# Patient Record
Sex: Male | Born: 1980 | Race: White | Hispanic: No | Marital: Single | Smoking: Former smoker
Health system: Southern US, Community
[De-identification: ages and names within clinical notes are randomized; demographics above are authoritative.]

## PROBLEM LIST (undated history)

## (undated) DIAGNOSIS — I10 Essential (primary) hypertension: Secondary | ICD-10-CM

## (undated) DIAGNOSIS — E785 Hyperlipidemia, unspecified: Secondary | ICD-10-CM

## (undated) DIAGNOSIS — E669 Obesity, unspecified: Secondary | ICD-10-CM

## (undated) HISTORY — PX: CARPAL TUNNEL RELEASE: SHX101

## (undated) HISTORY — DX: Essential (primary) hypertension: I10

## (undated) HISTORY — DX: Obesity, unspecified: E66.9

## (undated) HISTORY — DX: Hyperlipidemia, unspecified: E78.5

## (undated) HISTORY — PX: COLONOSCOPY: SHX174

---

## 2010-08-07 ENCOUNTER — Ambulatory Visit (INDEPENDENT_AMBULATORY_CARE_PROVIDER_SITE_OTHER): Payer: BC Managed Care – PPO | Admitting: Cardiology

## 2010-08-07 ENCOUNTER — Encounter: Payer: Self-pay | Admitting: Cardiology

## 2010-08-07 VITALS — BP 124/79 | HR 67 | Ht 66.0 in | Wt 237.0 lb

## 2010-08-07 DIAGNOSIS — I10 Essential (primary) hypertension: Secondary | ICD-10-CM

## 2010-08-07 DIAGNOSIS — F329 Major depressive disorder, single episode, unspecified: Secondary | ICD-10-CM

## 2010-08-07 DIAGNOSIS — E782 Mixed hyperlipidemia: Secondary | ICD-10-CM

## 2010-08-07 MED ORDER — VENLAFAXINE HCL 75 MG PO TABS: 75.0000 mg | ORAL_TABLET | ORAL | Status: DC

## 2010-08-07 NOTE — Progress Notes (Signed)
   Patient ID: Willie Osborne, male    DOB: 1981-03-18, 30 y.o.   MRN: [id number]  HPI  Willie Osborne comes in today self referred for history of HTN and HL. He has been under a tremendous amount of stress with domestic issues. He is having a hard time sleeping, losing desire to get up and work, and is a stress eater having gained 25 lbs.   He was prescribed Lipitor within the past year but had a major rash to it. He is currently not taking any meds for his BP of Lipids. 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  EKG is normal other than sinus bradycardia.    Review of Systems  [all other systems reviewed and are negative      Physical Exam  Constitutional:       Overweight, muscular, NAD  HENT:  Head: Normocephalic and atraumatic.  Eyes: EOM are normal. Pupils are equal, round, and reactive to light.  Neck: Neck supple. No JVD present. Tracheal deviation present. Thyromegaly present.  Cardiovascular: Normal rate, regular rhythm, normal heart sounds and intact distal pulses.   Pulmonary/Chest: Effort normal and breath sounds normal.  Abdominal: Soft.  Musculoskeletal: Normal range of motion. He exhibits no edema.  Skin: Skin is dry.  Psychiatric:       Depressed affect

## 2010-08-07 NOTE — Patient Instructions (Signed)
**Note De-Identified Dayan Desa Obfuscation** Your physician recommends that you schedule a follow-up appointment in: 3 months Your physician has recommended you make the following change in your medication: start taking Effexor 75mg   every morning

## 2010-08-23 ENCOUNTER — Encounter: Payer: Self-pay | Admitting: Cardiology

## 2010-09-10 ENCOUNTER — Telehealth: Payer: Self-pay

## 2010-09-10 DIAGNOSIS — F329 Major depressive disorder, single episode, unspecified: Secondary | ICD-10-CM

## 2010-09-10 MED ORDER — VENLAFAXINE HCL 100 MG PO TABS: 150.0000 mg | ORAL_TABLET | ORAL | Status: DC

## 2010-11-06 ENCOUNTER — Ambulatory Visit (INDEPENDENT_AMBULATORY_CARE_PROVIDER_SITE_OTHER): Payer: BC Managed Care – PPO | Admitting: Cardiology

## 2010-11-06 ENCOUNTER — Encounter: Payer: Self-pay | Admitting: Cardiology

## 2010-11-06 DIAGNOSIS — F32A Depression, unspecified: Secondary | ICD-10-CM | POA: Insufficient documentation

## 2010-11-06 DIAGNOSIS — F329 Major depressive disorder, single episode, unspecified: Secondary | ICD-10-CM | POA: Insufficient documentation

## 2010-11-06 DIAGNOSIS — E785 Hyperlipidemia, unspecified: Secondary | ICD-10-CM | POA: Insufficient documentation

## 2010-11-06 MED ORDER — VENLAFAXINE HCL ER 150 MG PO CP24: 150.0000 mg | ORAL_CAPSULE | Freq: Every day | ORAL | Status: DC

## 2010-11-06 NOTE — Assessment & Plan Note (Signed)
Will change to sustained release Effexor 150 mg q.h.s.

## 2010-11-06 NOTE — Patient Instructions (Addendum)
**Note De-Identified Pranika Finks Obfuscation** Your physician has recommended you make the following change in your medication: start taking Effexor 150mg  daily  Your physician recommends that you return for lab work in: 6 weeks   Your physician recommends that you schedule a follow-up appointment in: as needed

## 2010-11-06 NOTE — Assessment & Plan Note (Signed)
We'll obtain fasting lipids and a comprehensive metabolic profile. I will check with our Pharm D Lipid clinic to see if he could tolerate a different statin. I'm not sure I've ever seen a rash to his statin in the past.

## 2010-11-06 NOTE — Progress Notes (Signed)
HPI Willie Osborne returns today for followup of his hyperlipidemia and depression.  He had a significant rash to Lipitor. We did not try another statin.  His depression has been better on Effexor. However, he complains of having highs and lows when he takes it twice a day. He also gets extremely weak and dizzy about an hour after taking it in the mornings. His domestic situation is not improved in fact has gotten worse. He clearly needs to stay on medication. Past Medical History  Diagnosis Date  . Hyperlipidemia     No past surgical history on file.  No family history on file.  History   Social History  . Marital Status: Single    Spouse Name: bryanna Angerer    Number of Children: N/A  . Years of Education: N/A   Occupational History  . farm Hydrographic surveyor   Social History Main Topics  . Smoking status: Never Smoker   . Smokeless tobacco: Never Used  . Alcohol Use: Not on file  . Drug Use: No  . Sexually Active: Not on file   Other Topics Concern  . Not on file   Social History Narrative  . No narrative on file    Allergies  Allergen Reactions  . Lipitor (Atorvastatin Calcium) Other (See Comments)    neuralgia  . Sulfur     Current Outpatient Prescriptions  Medication Sig Dispense Refill  . DISCONTD: venlafaxine (EFFEXOR) 100 MG tablet Take 1.5 tablets (150 mg total) by mouth 1 dose over 46 hours. Take 1 tablet every morning  45 tablet  5  . venlafaxine (EFFEXOR XR) 150 MG 24 hr capsule Take 1 capsule (150 mg total) by mouth daily.  30 capsule  11    ROS Negative other than HPI.   PE General Appearance: well developed, well nourished in no acute distress, muscular, obese HEENT: symmetrical face, PERRLA, good dentition  Neck: no JVD, thyromegaly, or adenopathy, trachea midline Chest: symmetric without deformity Cardiac: PMI non-displaced, RRR, normal S1, S2, no gallop or murmur Lung: clear to ausculation and percussion Vascular: all pulses full  without bruits  Abdominal: nondistended, nontender, good bowel sounds, no HSM, no bruits Extremities: no cyanosis, clubbing or edema, no sign of DVT, no varicosities  Skin: normal color, no rashes Neuro: alert and oriented x 3, non-focal Pysch: normal affect Filed Vitals:   11/06/10 1157  BP: 129/81  Pulse: 63  Height: 5\' 6"  (1.676 m)  Weight: 234 lb (106.142 kg)  SpO2: 96%    EKG  Labs and Studies Reviewed.   No results found for this basename: WBC, HGB, HCT, MCV, PLT      Chemistry   No results found for this basename: NA, K, CL, CO2, BUN, CREATININE, GLU   No results found for this basename: CALCIUM, ALKPHOS, AST, ALT, BILITOT       No results found for this basename: CHOL   No results found for this basename: HDL   No results found for this basename: LDLCALC   No results found for this basename: TRIG   No results found for this basename: CHOLHDL   No results found for this basename: HGBA1C   No results found for this basename: ALT, AST, GGT, ALKPHOS, BILITOT   No results found for this basename: TSH

## 2010-12-13 ENCOUNTER — Encounter: Payer: Self-pay | Admitting: Family Medicine

## 2013-05-17 ENCOUNTER — Ambulatory Visit: Payer: Self-pay | Admitting: Physician Assistant

## 2013-07-12 ENCOUNTER — Encounter: Payer: Self-pay | Admitting: Physician Assistant

## 2013-07-12 ENCOUNTER — Ambulatory Visit (INDEPENDENT_AMBULATORY_CARE_PROVIDER_SITE_OTHER): Payer: BC Managed Care – PPO | Admitting: Physician Assistant

## 2013-07-12 VITALS — BP 118/78 | HR 68 | Temp 98.2°F | Resp 18 | Ht 67.0 in | Wt 240.0 lb

## 2013-07-12 DIAGNOSIS — J329 Chronic sinusitis, unspecified: Secondary | ICD-10-CM

## 2013-07-12 DIAGNOSIS — J209 Acute bronchitis, unspecified: Secondary | ICD-10-CM

## 2013-07-12 MED ORDER — BENZONATATE 200 MG PO CAPS: 200.0000 mg | ORAL_CAPSULE | Freq: Two times a day (BID) | ORAL | Status: DC | PRN

## 2013-07-12 MED ORDER — LEVOFLOXACIN 750 MG PO TABS: 750.0000 mg | ORAL_TABLET | Freq: Every day | ORAL | Status: DC

## 2013-07-12 MED ORDER — PREDNISONE 20 MG PO TABS: ORAL_TABLET | ORAL | Status: DC

## 2013-07-12 NOTE — Progress Notes (Signed)
Patient ID: Willie Osborne MRN: [id number], DOB: [birth year], 33 y.o. Date of Encounter: 07/12/2013, 10:50 AM    Chief Complaint:  Chief Complaint  Patient presents with  . recurrent sinus infection    has had abx x 2, (augmentin and levaquin)     HPI: 33 y.o. year old white  male says that he has gone to the [facility].  First visit there was in mid December. At that visit he was prescribed Augmentin Flonase and Tessalon. Second visit there was about 3 weeks ago.  Says that when he went to that visit he was having exact same symptoms that he is having now. Says at that visit they prescribed Levaquin-- for either 5 or 7 days--cannot remember which but remembers was either 5 or 7 days. Was also prescribed more Flonase and Tessalon at that time. Says that he completed the Levaquin and was 90% improved. However symptoms never completely resolved. Now his symptoms are back to being just as bad as they were when [facility] 3 weeks ago. He is having a lot of pressure congestion and pain in his frontal and maxillary sinus areas. Is blowing thick dark mucus from his nose.  Also has a bad cough with lots of thick dark phlegm.  I asked if He had any history of having such problems getting rid of infections. He says he has never had this problem. He rarely smokes. Does not even smoke a cigarette on a daily basis. Has had no known fever. 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      Home Meds: See attached medication section for any medications that were entered at today's visit. The computer does not put those onto this list.The following list is a list of meds entered prior to today's visit.   No current outpatient prescriptions on file prior to visit.   No current facility-administered medications on file prior to visit.    Allergies:  Allergies  Allergen Reactions  . Lipitor [Atorvastatin Calcium] Other (See Comments)   neuralgia  . Sulfur       Review of Systems: See HPI for pertinent ROS. All other ROS negative.    Physical Exam: Blood pressure 118/78, pulse 68, temperature 98.2 F (36.8 C), temperature source Oral, resp. rate 18, height 5\' 7"  (1.702 m), weight 240 lb (108.863 kg)., Body mass index is 37.58 kg/(m^2). General:  Stocky built white male. Appears in no acute distress. HEENT: Normocephalic, atraumatic, eyes without discharge, sclera non-icteric, nares are without discharge. Bilateral auditory canals clear, TM's are without perforation, pearly grey and translucent with reflective cone of light bilaterally. Oral cavity moist, posterior pharynx without exudate, erythema, peritonsillar abscess. Minimal tenderness with percussion of frontal and maxillary sinuses now. However he says that his areas were very sore and tender yesterday.  Neck: Supple. No thyromegaly. No lymphadenopathy. Lungs:  Very slight/minimal wheeze at the end of expiration throughout bilaterally.  Good air movement and good breath sounds.  Heart: Regular rhythm. No murmurs, rubs, or gallops. Msk:  Strength and tone normal for age. Extremities/Skin: Warm and dry. Neuro: Alert and oriented X 3. Moves all extremities spontaneously. Gait is normal. CNII-XII grossly in tact. Psych:  Responds to questions appropriately with a normal affect.     ASSESSMENT AND PLAN:  33 y.o. year old male with  1. Sinusitis - predniSONE (DELTASONE) 20 MG tablet; Take 3 daily for 2 days, then 2 daily for 2 days,  then 1 daily for 2 days.  Dispense: 12 tablet; Refill: 0 - levofloxacin (LEVAQUIN) 750 MG tablet; Take 1 tablet (750 mg total) by mouth daily.  Dispense: 10 tablet; Refill: 0 - benzonatate (TESSALON) 200 MG capsule; Take 1 capsule (200 mg total) by mouth 2 (two) times daily as needed for cough.  Dispense: 20 capsule; Refill: 0  2. Acute bronchitis - predniSONE (DELTASONE) 20 MG tablet; Take 3 daily for 2 days, then 2 daily for 2 days, then  1 daily for 2 days.  Dispense: 12 tablet; Refill: 0 - levofloxacin (LEVAQUIN) 750 MG tablet; Take 1 tablet (750 mg total) by mouth daily.  Dispense: 10 tablet; Refill: 0 - benzonatate (TESSALON) 200 MG capsule; Take 1 capsule (200 mg total) by mouth 2 (two) times daily as needed for cough.  Dispense: 20 capsule; Refill: 0  In addition to the above medications take Mucinex DM as an expectorant during the day. If his symptoms do not completely resolve by the completion of antibiotics , I have been told him to follow up with [place] immediately.  Signed, [address] [place], [place], [facility] 07/12/2013 10:50 AM

## 2013-10-22 ENCOUNTER — Encounter: Payer: Self-pay | Admitting: Family Medicine

## 2013-10-22 ENCOUNTER — Ambulatory Visit (INDEPENDENT_AMBULATORY_CARE_PROVIDER_SITE_OTHER): Payer: BC Managed Care – PPO | Admitting: Family Medicine

## 2013-10-22 VITALS — BP 128/70 | HR 64 | Temp 97.2°F | Resp 14 | Ht 66.0 in | Wt 247.0 lb

## 2013-10-22 DIAGNOSIS — Z0000 Encounter for general adult medical examination without abnormal findings: Secondary | ICD-10-CM

## 2013-10-22 DIAGNOSIS — B372 Candidiasis of skin and nail: Secondary | ICD-10-CM

## 2013-10-22 DIAGNOSIS — E785 Hyperlipidemia, unspecified: Secondary | ICD-10-CM

## 2013-10-22 MED ORDER — CLOTRIMAZOLE-BETAMETHASONE 1-0.05 % EX CREA: 1.0000 "application " | TOPICAL_CREAM | Freq: Two times a day (BID) | CUTANEOUS | Status: DC

## 2013-10-22 NOTE — Progress Notes (Signed)
   Subjective:    Patient ID: Willie Osborne, male    DOB: 1980-12-22, 33 y.o.   MRN: [id number]  HPI  Patient is overdue for fasting lab work. He would like to return fasting for CBC, CMP, fasting lipid panel. His biggest concern today is a rash in his groin. It is located between his scrotum and his bilateral medial thighs.   He has an erythematous patch in the intertriginous folds bilaterally.  He's tried topical over-the-counter antifungals with no success. There is no evidence of folliculitis or cellulitis or abscess. Past Medical History  Diagnosis Date  . Hyperlipidemia    No current outpatient prescriptions on file prior to visit.   No current facility-administered medications on file prior to visit.   Allergies  Allergen Reactions  . Lipitor [Atorvastatin Calcium] Other (See Comments)    neuralgia  . Sulfur    History   Social History  . Marital Status: Single    Spouse Name: bryanna Stork    Number of Children: N/A  . Years of Education: N/A   Occupational History  . farm Catering manager   Social History Main Topics  . Smoking status: Never Smoker   . Smokeless tobacco: Never Used  . Alcohol Use: Not on file  . Drug Use: No  . Sexual Activity: Not on file   Other Topics Concern  . Not on file   Social History Narrative  . No narrative on file     Review of Systems  All other systems reviewed and are negative.      Objective:   Physical Exam  Vitals reviewed. Cardiovascular: Normal rate and regular rhythm.   Pulmonary/Chest: Effort normal and breath sounds normal.  Genitourinary: Penis normal.  Lymphadenopathy:       Right: No inguinal adenopathy present.       Left: No inguinal adenopathy present.  Skin: Rash noted. There is erythema.   Intertrigo-like rash in the intertriginous folds between the scrotum and the thigh bilaterally characterized by an erythematous patch with satellite erythematous macules.       Assessment  & Plan:  1. Candidiasis, intertrigo At present reading twice a day for 10 days.   - clotrimazole-betamethasone (LOTRISONE) cream; Apply 1 application topically 2 (two) times daily.  Dispense: 30 g; Refill: 2  2. HLD (hyperlipidemia) Return fasting for a fasting lipid panel and CMP at the patient's earliest convenience. - COMPLETE METABOLIC PANEL WITH GFR; Future - Lipid panel; Future  3. Routine general medical examination at a health care facility Patient is overdue for complete physical exam. I like to draw CBC prior to his complete physical exam.  He can schedule the complete physical exam at his earliest convenience. - CBC with Differential; Future

## 2014-02-02 NOTE — H&P (Signed)
  NTS SOAP Note  Vital Signs:  Vitals as of: 2/50/5397: Systolic 673: Diastolic 90: Heart Rate 59: Temp 98.64F: Height 58ft 6in: Weight 250Lbs 0 Ounces: Pain Level 3: BMI 40.35  BMI : 40.35 kg/m2  Subjective: This 33 year old male presents for of a nonresolving cyst on the scrotum.  Had i and d in the past,  but wound keeps getting infected and indurated.  Intermittent drainage noted.  Several rounds of antibiotics not helpful.  Review of Symptoms:  Constitutional:unremarkable   Head:unremarkable Eyes:unremarkable  negative Nose/Mouth/Throat:unremarkable Cardiovascular:  unremarkable Respiratory:unremarkable Gastrointestinal:  unremarkable   Genitourinary:unremarkable   Musculoskeletal:unremarkable Hematolgic/Lymphatic:unremarkable   Allergic/Immunologic:unremarkable   Past Medical History:  Reviewed  Past Medical History  Surgical History: carpal tunnel release right Medical Problems: none Allergies: sulfa Medications: none   Social History:Reviewed  Social History  Preferred Language: English Race:  White Ethnicity: Not Hispanic / Latino Age: 64 year Marital Status:  S Alcohol: socially   Smoking Status: Never smoker reviewed on 02/01/2014 Functional Status reviewed on 02/01/2014 ------------------------------------------------ Bathing: Normal Cooking: Normal Dressing: Normal Driving: Normal Eating: Normal Managing Meds: Normal Oral Care: Normal Shopping: Normal Toileting: Normal Transferring: Normal Walking: Normal Cognitive Status reviewed on 02/01/2014 ------------------------------------------------ Attention: Normal Decision Making: Normal Language: Normal Memory: Normal Motor: Normal Perception: Normal Problem Solving: Normal Visual and Spatial: Normal   Family History:Reviewed  Family Health History Mother, Living; Healthy;  Father, Living; Healthy;     Objective Information: General:Well appearing, well  nourished in no distress. 2cm residual cystic open wound on base of scrotum.  No purulent drainage currently. Heart:RRR, no murmur Lungs:  CTA bilaterally, no wheezes, rhonchi, rales.  Breathing unlabored.  Assessment:Sebaceous cyst,  scrotum,  recurrent  Diagnoses: 706.2  L72.3 Epidermoid cyst of skin (Sebaceous cyst)  Procedures: 41937 - OFFICE OUTPATIENT NEW 20 MINUTES    Plan:  Scheduled for excision of sebaceous cyst,  scrotum on 02/25/14.   Patient Education:Alternative treatments to surgery were discussed with patient (and family).  Risks and benefits  of procedure were fully explained to the patient (and family) who gave informed consent. Patient/family questions were addressed.  Follow-up:Pending Surgery

## 2014-02-10 ENCOUNTER — Encounter (HOSPITAL_COMMUNITY): Payer: Self-pay | Admitting: Pharmacy Technician

## 2014-02-18 NOTE — Patient Instructions (Signed)
Willie Osborne  02/18/2014   Your procedure is scheduled on:  02/25/14  Report to [facility] at 06:15 AM.  Call this number if you have problems the morning of surgery: [phone]   Remember:   Do not eat food or drink liquids after midnight.   Take these medicines the morning of surgery with A SIP OF WATER: None   Do not wear jewelry, make-up or nail polish.  Do not wear lotions, powders, or perfumes. You may wear deodorant.  Do not shave 48 hours prior to surgery. Men may shave face and neck.  Do not bring valuables to the hospital.  [facility] is not responsible for any belongings or valuables.               Contacts, dentures or bridgework may not be worn into surgery.  Leave suitcase in the car. After surgery it may be brought to your room.  For patients admitted to the hospital, discharge time is determined by your treatment team.               Patients discharged the day of surgery will not be allowed to drive home.    Special Instructions: Shower using Hibiclens the night before surgery and the morning of surgery.   Please read over the following fact sheets that you were given: Pain Booklet, Anesthesia Post-op Instructions and Care and Recovery After Surgery    Cyst Removal Your caregiver has removed a cyst. A cyst is a sac containing a semi-solid material. Cysts may occur any place on your body. They may remain small for years or gradually get larger. A sebaceous cyst is an enlarged (dilated) sweat gland filled with old sweat (sebum). Unattended, these may become large (the size of a softball) over several years time. These are often removed for improved appearance (cosmetic) reasons or before they become infected to form an abscess. An abscess is an infected cyst. HOME CARE INSTRUCTIONS   Keep your bandage clean and dry. You may change your bandage after 24 hours. If your bandage sticks, use warm water to gently loosen it. Pat the area dry with a clean towel before  putting on another bandage.  If possible, keep the area where the cyst was removed raised to relieve soreness, swelling, and promote healing.  If you have stitches, keep them clean and dry.  You may clean your stitches gently with a cotton swab dipped in warm soapy water.  Do not soak the area where the cyst was removed or go swimming. You may shower.  Do not overuse the area where your cyst was removed.  Return in 7 days or as directed to have your stitches removed.  Take medicines as instructed by your caregiver. SEEK IMMEDIATE MEDICAL CARE IF:   An oral temperature above 102 F (38.9 C) develops, not controlled by medication.  Blood continues to soak through the bandage.  You have increasing pain in the area where your cyst was removed.  You have redness, swelling, pus, a bad smell, soreness (inflammation), or red streaks coming away from the stitches. These are signs of infection. MAKE SURE YOU:   Understand these instructions.  Will watch your condition.  Will get help right away if you are not doing well or get worse. Document Released: 04/26/2000 Document Revised: 07/22/2011 Document Reviewed: 08/20/2007 [facility] Patient Information 2015 [place], [place]. This information is not intended to replace advice given to you by your health care provider. Make sure you discuss any questions  you have with your health care provider.    PATIENT INSTRUCTIONS POST-ANESTHESIA  IMMEDIATELY FOLLOWING SURGERY:  Do not drive or operate machinery for the first twenty four hours after surgery.  Do not make any important decisions for twenty four hours after surgery or while taking narcotic pain medications or sedatives.  If you develop intractable nausea and vomiting or a severe headache please notify your doctor immediately.  FOLLOW-UP:  Please make an appointment with your surgeon as instructed. You do not need to follow up with anesthesia unless specifically instructed to do  so.  WOUND CARE INSTRUCTIONS (if applicable):  Keep a dry clean dressing on the anesthesia/puncture wound site if there is drainage.  Once the wound has quit draining you may leave it open to air.  Generally you should leave the bandage intact for twenty four hours unless there is drainage.  If the epidural site drains for more than 36-48 hours please call the anesthesia department.  QUESTIONS?:  Please feel free to call your physician or the hospital operator if you have any questions, and they will be happy to assist you.

## 2014-02-21 ENCOUNTER — Encounter (HOSPITAL_COMMUNITY): Payer: Self-pay

## 2014-02-21 ENCOUNTER — Encounter (HOSPITAL_COMMUNITY)
Admission: RE | Admit: 2014-02-21 | Discharge: 2014-02-21 | Disposition: A | Discharge status: Home / Self Care | Payer: BC Managed Care – PPO | Source: Ambulatory Visit | Attending: General Surgery | Admitting: General Surgery

## 2014-02-21 DIAGNOSIS — F329 Major depressive disorder, single episode, unspecified: Secondary | ICD-10-CM | POA: Insufficient documentation

## 2014-02-21 DIAGNOSIS — Z882 Allergy status to sulfonamides status: Secondary | ICD-10-CM | POA: Insufficient documentation

## 2014-02-21 DIAGNOSIS — E785 Hyperlipidemia, unspecified: Secondary | ICD-10-CM | POA: Diagnosis not present

## 2014-02-21 DIAGNOSIS — L728 Other follicular cysts of the skin and subcutaneous tissue: Secondary | ICD-10-CM | POA: Insufficient documentation

## 2014-02-21 DIAGNOSIS — Z0000 Encounter for general adult medical examination without abnormal findings: Secondary | ICD-10-CM | POA: Diagnosis not present

## 2014-02-21 DIAGNOSIS — Z888 Allergy status to other drugs, medicaments and biological substances status: Secondary | ICD-10-CM | POA: Diagnosis not present

## 2014-02-21 LAB — CBC WITH DIFFERENTIAL/PLATELET
BASOS PCT: 1 % (ref 0–1)
Basophils Absolute: 0.1 10*3/uL (ref 0.0–0.1)
EOS ABS: 0.3 10*3/uL (ref 0.0–0.7)
Eosinophils Relative: 5 % (ref 0–5)
HCT: 45.9 % (ref 39.0–52.0)
Hemoglobin: 16.1 g/dL (ref 13.0–17.0)
LYMPHS ABS: 1.9 10*3/uL (ref 0.7–4.0)
Lymphocytes Relative: 27 % (ref 12–46)
MCH: 31.7 pg (ref 26.0–34.0)
MCHC: 35.1 g/dL (ref 30.0–36.0)
MCV: 90.4 fL (ref 78.0–100.0)
Monocytes Absolute: 0.4 10*3/uL (ref 0.1–1.0)
Monocytes Relative: 6 % (ref 3–12)
Neutro Abs: 4.4 10*3/uL (ref 1.7–7.7)
Neutrophils Relative %: 62 % (ref 43–77)
Platelets: 133 10*3/uL — ABNORMAL LOW (ref 150–400)
RBC: 5.08 MIL/uL (ref 4.22–5.81)
RDW: 13.2 % (ref 11.5–15.5)
WBC: 7.1 10*3/uL (ref 4.0–10.5)

## 2014-02-21 LAB — BASIC METABOLIC PANEL
Anion gap: 12 (ref 5–15)
BUN: 15 mg/dL (ref 6–23)
CO2: 24 mEq/L (ref 19–32)
Calcium: 8.9 mg/dL (ref 8.4–10.5)
Chloride: 104 mEq/L (ref 96–112)
Creatinine, Ser: 0.97 mg/dL (ref 0.50–1.35)
GFR calc non Af Amer: >90 mL/min (ref >90)
Glucose, Bld: 104 mg/dL — ABNORMAL HIGH (ref 70–99)
Potassium: 4.4 mEq/L (ref 3.7–5.3)
SODIUM: 140 meq/L (ref 137–147)

## 2014-02-21 NOTE — Pre-Procedure Instructions (Signed)
Patient given information to sign up for my chart at home. 

## 2014-02-25 ENCOUNTER — Encounter (HOSPITAL_COMMUNITY): Payer: BC Managed Care – PPO | Admitting: Anesthesiology

## 2014-02-25 ENCOUNTER — Ambulatory Visit (HOSPITAL_COMMUNITY): Payer: BC Managed Care – PPO | Admitting: Anesthesiology

## 2014-02-25 ENCOUNTER — Encounter (HOSPITAL_COMMUNITY)
Admission: RE | Disposition: A | Discharge status: Home / Self Care | Payer: Self-pay | Source: Ambulatory Visit | Attending: General Surgery

## 2014-02-25 ENCOUNTER — Ambulatory Visit (HOSPITAL_COMMUNITY)
Admission: RE | Admit: 2014-02-25 | Discharge: 2014-02-25 | Disposition: A | Discharge status: Home / Self Care | Payer: BC Managed Care – PPO | Source: Ambulatory Visit | Attending: General Surgery | Admitting: General Surgery

## 2014-02-25 ENCOUNTER — Encounter (HOSPITAL_COMMUNITY): Payer: Self-pay

## 2014-02-25 DIAGNOSIS — L729 Follicular cyst of the skin and subcutaneous tissue, unspecified: Secondary | ICD-10-CM | POA: Diagnosis not present

## 2014-02-25 DIAGNOSIS — F329 Major depressive disorder, single episode, unspecified: Secondary | ICD-10-CM | POA: Diagnosis not present

## 2014-02-25 DIAGNOSIS — Z87891 Personal history of nicotine dependence: Secondary | ICD-10-CM | POA: Diagnosis not present

## 2014-02-25 HISTORY — PX: EAR CYST EXCISION: SHX22

## 2014-02-25 SURGERY — CYST REMOVAL
Anesthesia: General | Site: Scrotum

## 2014-02-25 MED ORDER — LACTATED RINGERS IV SOLN
INTRAVENOUS | Status: DC | PRN
  Administered 2014-02-25: 07:00:00 via INTRAVENOUS

## 2014-02-25 MED ORDER — KETOROLAC TROMETHAMINE 30 MG/ML IJ SOLN
30.0000 mg | Freq: Once | INTRAMUSCULAR | Status: AC
  Administered 2014-02-25: 30 mg via INTRAVENOUS
  Filled 2014-02-25: qty 1

## 2014-02-25 MED ORDER — HYDROCODONE-ACETAMINOPHEN 5-325 MG PO TABS: 1.0000 | ORAL_TABLET | ORAL | Status: AC | PRN

## 2014-02-25 MED ORDER — ONDANSETRON HCL 4 MG/2ML IJ SOLN
INTRAMUSCULAR | Status: DC | PRN
  Administered 2014-02-25: 4 mg via INTRAVENOUS

## 2014-02-25 MED ORDER — LIDOCAINE HCL (CARDIAC) 20 MG/ML IV SOLN
INTRAVENOUS | Status: DC | PRN
  Administered 2014-02-25: 80 mg via INTRAVENOUS

## 2014-02-25 MED ORDER — BUPIVACAINE HCL (PF) 0.5 % IJ SOLN
INTRAMUSCULAR | Status: AC
  Filled 2014-02-25: qty 30

## 2014-02-25 MED ORDER — FENTANYL CITRATE 0.05 MG/ML IJ SOLN
INTRAMUSCULAR | Status: AC
  Filled 2014-02-25: qty 5

## 2014-02-25 MED ORDER — MIDAZOLAM HCL 2 MG/2ML IJ SOLN
INTRAMUSCULAR | Status: AC
  Filled 2014-02-25: qty 2

## 2014-02-25 MED ORDER — POVIDONE-IODINE 10 % EX OINT
TOPICAL_OINTMENT | CUTANEOUS | Status: AC
  Filled 2014-02-25: qty 1

## 2014-02-25 MED ORDER — CHLORHEXIDINE GLUCONATE 4 % EX LIQD: 1.0000 "application " | Freq: Once | CUTANEOUS | Status: DC

## 2014-02-25 MED ORDER — MIDAZOLAM HCL 2 MG/2ML IJ SOLN
1.0000 mg | INTRAMUSCULAR | Status: DC | PRN
  Administered 2014-02-25: 2 mg via INTRAVENOUS

## 2014-02-25 MED ORDER — PROPOFOL 10 MG/ML IV EMUL
INTRAVENOUS | Status: AC
  Filled 2014-02-25: qty 20

## 2014-02-25 MED ORDER — FENTANYL CITRATE 0.05 MG/ML IJ SOLN
INTRAMUSCULAR | Status: AC
  Filled 2014-02-25: qty 2

## 2014-02-25 MED ORDER — BUPIVACAINE HCL (PF) 0.5 % IJ SOLN
INTRAMUSCULAR | Status: DC | PRN
  Administered 2014-02-25: 2 mL

## 2014-02-25 MED ORDER — CEFAZOLIN SODIUM-DEXTROSE 2-3 GM-% IV SOLR
INTRAVENOUS | Status: AC
  Filled 2014-02-25: qty 50

## 2014-02-25 MED ORDER — ONDANSETRON HCL 4 MG/2ML IJ SOLN
INTRAMUSCULAR | Status: AC
  Filled 2014-02-25: qty 2

## 2014-02-25 MED ORDER — CEFAZOLIN SODIUM-DEXTROSE 2-3 GM-% IV SOLR: 2.0000 g | INTRAVENOUS | Status: DC

## 2014-02-25 MED ORDER — PROPOFOL 10 MG/ML IV BOLUS
INTRAVENOUS | Status: DC | PRN
Start: 2014-02-25 — End: 2014-02-25
  Administered 2014-02-25: 300 mg via INTRAVENOUS

## 2014-02-25 MED ORDER — CEFAZOLIN SODIUM-DEXTROSE 2-3 GM-% IV SOLR
INTRAVENOUS | Status: DC | PRN
  Administered 2014-02-25: 2 g via INTRAVENOUS

## 2014-02-25 MED ORDER — FENTANYL CITRATE 0.05 MG/ML IJ SOLN
INTRAMUSCULAR | Status: DC | PRN
  Administered 2014-02-25 (×3): 50 ug via INTRAVENOUS

## 2014-02-25 MED ORDER — SODIUM CHLORIDE 0.9 % IR SOLN
Status: DC | PRN
  Administered 2014-02-25: 1000 mL

## 2014-02-25 MED ORDER — LIDOCAINE HCL (PF) 1 % IJ SOLN
INTRAMUSCULAR | Status: AC
  Filled 2014-02-25: qty 5

## 2014-02-25 MED ORDER — GLYCOPYRROLATE 0.2 MG/ML IJ SOLN
INTRAMUSCULAR | Status: AC
  Filled 2014-02-25: qty 1

## 2014-02-25 MED ORDER — FENTANYL CITRATE 0.05 MG/ML IJ SOLN
25.0000 ug | INTRAMUSCULAR | Status: AC
  Administered 2014-02-25 (×2): 25 ug via INTRAVENOUS

## 2014-02-25 MED ORDER — LACTATED RINGERS IV SOLN
INTRAVENOUS | Status: DC
  Administered 2014-02-25: 07:00:00 via INTRAVENOUS

## 2014-02-25 MED ORDER — GLYCOPYRROLATE 0.2 MG/ML IJ SOLN
0.2000 mg | Freq: Once | INTRAMUSCULAR | Status: AC
  Administered 2014-02-25: 0.2 mg via INTRAVENOUS

## 2014-02-25 MED ORDER — ONDANSETRON HCL 4 MG/2ML IJ SOLN: 4.0000 mg | Freq: Once | INTRAMUSCULAR | Status: DC | PRN

## 2014-02-25 MED ORDER — ONDANSETRON HCL 4 MG/2ML IJ SOLN
4.0000 mg | Freq: Once | INTRAMUSCULAR | Status: AC
  Administered 2014-02-25: 4 mg via INTRAVENOUS

## 2014-02-25 MED ORDER — FENTANYL CITRATE 0.05 MG/ML IJ SOLN: 25.0000 ug | INTRAMUSCULAR | Status: DC | PRN

## 2014-02-25 SURGICAL SUPPLY — 38 items
ADH SKN CLS APL DERMABOND .7 (GAUZE/BANDAGES/DRESSINGS) ×1
BAG HAMPER (MISCELLANEOUS) ×3 IMPLANT
CLOTH BEACON ORANGE TIMEOUT ST (SAFETY) ×3 IMPLANT
COVER LIGHT HANDLE STERIS (MISCELLANEOUS) ×6 IMPLANT
COVER MAYO STAND XLG (DRAPE) ×2 IMPLANT
DECANTER SPIKE VIAL GLASS SM (MISCELLANEOUS) ×3 IMPLANT
DERMABOND ADVANCED (GAUZE/BANDAGES/DRESSINGS) ×2
DERMABOND ADVANCED .7 DNX12 (GAUZE/BANDAGES/DRESSINGS) IMPLANT
DRAPE EENT ADH APERT 31X51 STR (DRAPES) ×3 IMPLANT
ELECT NDL TIP 2.8 STRL (NEEDLE) IMPLANT
ELECT NEEDLE TIP 2.8 STRL (NEEDLE) IMPLANT
ELECT REM PT RETURN 9FT ADLT (ELECTROSURGICAL) ×3
ELECTRODE REM PT RTRN 9FT ADLT (ELECTROSURGICAL) ×1 IMPLANT
FORMALIN 10 PREFIL 120ML (MISCELLANEOUS) ×3 IMPLANT
GLOVE BIOGEL PI IND STRL 7.0 (GLOVE) IMPLANT
GLOVE BIOGEL PI IND STRL 7.5 (GLOVE) IMPLANT
GLOVE BIOGEL PI INDICATOR 7.0 (GLOVE) ×2
GLOVE BIOGEL PI INDICATOR 7.5 (GLOVE) ×2
GLOVE ECLIPSE 7.0 STRL STRAW (GLOVE) ×2 IMPLANT
GLOVE SURG SS PI 7.5 STRL IVOR (GLOVE) ×6 IMPLANT
GOWN STRL REUS W/TWL LRG LVL3 (GOWN DISPOSABLE) ×6 IMPLANT
KIT ROOM TURNOVER APOR (KITS) ×3 IMPLANT
MANIFOLD NEPTUNE II (INSTRUMENTS) ×3 IMPLANT
NDL HYPO 25X1 1.5 SAFETY (NEEDLE) ×1 IMPLANT
NEEDLE HYPO 25X1 1.5 SAFETY (NEEDLE) ×3 IMPLANT
NS IRRIG 1000ML POUR BTL (IV SOLUTION) ×3 IMPLANT
PACK MINOR (CUSTOM PROCEDURE TRAY) ×2 IMPLANT
PAD ARMBOARD 7.5X6 YLW CONV (MISCELLANEOUS) ×3 IMPLANT
SET BASIN LINEN APH (SET/KITS/TRAYS/PACK) ×3 IMPLANT
SHEET LAVH (DRAPES) ×2 IMPLANT
SUT ETHILON 3 0 FSL (SUTURE) IMPLANT
SUT MNCRL AB 4-0 PS2 18 (SUTURE) ×4 IMPLANT
SUT PROLENE 4 0 PS 2 18 (SUTURE) IMPLANT
SUT VIC AB 3-0 SH 27 (SUTURE)
SUT VIC AB 3-0 SH 27X BRD (SUTURE) IMPLANT
SUT VIC AB 4-0 PS2 27 (SUTURE) IMPLANT
SYRINGE CONTROL L 12CC (SYRINGE) ×3 IMPLANT
SYRINGE CONTROL LL 12CC (SYRINGE) ×1 IMPLANT

## 2014-02-25 NOTE — Transfer of Care (Signed)
Immediate Anesthesia Transfer of Care Note  Patient: Willie Osborne  Procedure(s) Performed: Procedure(s): EXCISION CYST, SCROTUM (N/A)  Patient Location: PACU  Anesthesia Type:General  Level of Consciousness: awake, alert , oriented and patient cooperative  Airway & Oxygen Therapy: Patient Spontanous Breathing and Patient connected to face mask oxygen  Post-op Assessment: Report given to PACU RN, Post -op Vital signs reviewed and stable and Patient moving all extremities  Post vital signs: Reviewed and stable  Complications: No apparent anesthesia complications

## 2014-02-25 NOTE — Anesthesia Postprocedure Evaluation (Signed)
  Anesthesia Post-op Note  Patient: Willie Osborne  Procedure(s) Performed: Procedure(s): EXCISION CYST, SCROTUM (N/A)  Patient Location: PACU  Anesthesia Type:General  Level of Consciousness: awake, alert , oriented and patient cooperative  Airway and Oxygen Therapy: Patient Spontanous Breathing and Patient connected to face mask oxygen  Post-op Pain: none  Post-op Assessment: Post-op Vital signs reviewed, Patient's Cardiovascular Status Stable, Respiratory Function Stable and No signs of Nausea or vomiting  Post-op Vital Signs: Reviewed and stable  Last Vitals:  Filed Vitals:   02/25/14 0720  BP: 130/76  Pulse:   Temp:   Resp: 17    Complications: No apparent anesthesia complications

## 2014-02-25 NOTE — Interval H&P Note (Signed)
History and Physical Interval Note:  02/25/2014 7:23 AM  Willie Osborne  has presented today for surgery, with the diagnosis of cyst,scrotum  The various methods of treatment have been discussed with the patient and family. After consideration of risks, benefits and other options for treatment, the patient has consented to  Procedure(s): EXCISION CYST, SCROTUM (N/A) as a surgical intervention .  The patient's history has been reviewed, patient examined, no change in status, stable for surgery.  I have reviewed the patient's chart and labs.  Questions were answered to the patient's satisfaction.     Aviva Signs A

## 2014-02-25 NOTE — Anesthesia Preprocedure Evaluation (Signed)
Anesthesia Evaluation  Patient identified by MRN, date of birth, ID band Patient awake    Reviewed: Allergy & Precautions, H&P , NPO status , Patient's Chart, lab work & pertinent test results  Airway Mallampati: III TM Distance: >3 FB Neck ROM: Full    Dental  (+) Teeth Intact   Pulmonary former smoker,  breath sounds clear to auscultation        Cardiovascular negative cardio ROS  Rhythm:Regular Rate:Normal     Neuro/Psych PSYCHIATRIC DISORDERS Depression    GI/Hepatic negative GI ROS,   Endo/Other  Morbid obesity  Renal/GU      Musculoskeletal   Abdominal   Peds  Hematology   Anesthesia Other Findings   Reproductive/Obstetrics                           Anesthesia Physical Anesthesia Plan  ASA: II  Anesthesia Plan: General   Post-op Pain Management:    Induction: Intravenous  Airway Management Planned: LMA  Additional Equipment:   Intra-op Plan:   Post-operative Plan: Extubation in OR  Informed Consent: I have reviewed the patients History and Physical, chart, labs and discussed the procedure including the risks, benefits and alternatives for the proposed anesthesia with the patient or authorized representative who has indicated his/her understanding and acceptance.     Plan Discussed with:   Anesthesia Plan Comments:         Anesthesia Quick Evaluation

## 2014-02-25 NOTE — Op Note (Signed)
Patient:  Willie Osborne  DOB:  Jan 25, 1981  MRN:  [id number]   Preop Diagnosis:  Cyst, scrotum  Postop Diagnosis:  Same  Procedure:  Excision of 2 cm cyst, scrotum  Surgeon:  Aviva Signs, M.D.  Anes:  General  Indications:  Patient is a 33 year old white male who has undergone multiple incision and drainage of a cyst at the base of the scrotum and now presents for formal excision. The risks and benefits of the procedure were fully explained to the patient, who gave informed consent.  Procedure note:  The patient is placed the supine position. After general anesthesia was administered, the patient was placed in the lithotomy position. The base of the scrotum was prepped and draped using usual sterile technique with Betadine. Surgical site confirmation was performed.  At the base of the scrotum along the midline, 2 cm cystic lesion was noted. No drainage was noted at this time. Elliptical incision was made around this and this cyst was excised down to the subcutaneous layer without difficulty. It was disposed of. Any bleeding was controlled using Bovie electrocautery. 0.5% Sensorcaine was instilled the surrounding wound. The skin edges were reapproximated using a 4-0 Monocryl subcuticular suture. Dermabond was applied.  All tape and needle counts were correct at the end of the procedure. Patient was awakened and transferred to PACU in stable condition.  Complications:  None  EBL:  Minimal  Specimen:  None

## 2014-02-25 NOTE — Discharge Instructions (Signed)
Cyst Removal   Your caregiver has removed a cyst. A cyst is a sac containing a semi-solid material. Cysts may occur any place on your body. They may remain small for years or gradually get larger. A sebaceous cyst is an enlarged (dilated) sweat gland filled with old sweat (sebum). Unattended, these may become large (the size of a softball) over several years time. These are often removed for improved appearance (cosmetic) reasons or before they become infected to form an abscess. An abscess is an infected cyst.   HOME CARE INSTRUCTIONS   Keep your bandage clean and dry. You may change your bandage after 24 hours. If your bandage sticks, use warm water to gently loosen it. Pat the area dry with a clean towel before putting on another bandage.   If possible, keep the area where the cyst was removed raised to relieve soreness, swelling, and promote healing.   If you have stitches, keep them clean and dry.   You may clean your stitches gently with a cotton swab dipped in warm soapy water.   Do not soak the area where the cyst was removed or go swimming. You may shower.   Do not overuse the area where your cyst was removed.   Return in 7 days or as directed to have your stitches removed.   Take medicines as instructed by your caregiver.  SEEK IMMEDIATE MEDICAL CARE IF:   An oral temperature above 102° F (38.9° C) develops, not controlled by medication.   Blood continues to soak through the bandage.   You have increasing pain in the area where your cyst was removed.   You have redness, swelling, pus, a bad smell, soreness (inflammation), or red streaks coming away from the stitches. These are signs of infection.  MAKE SURE YOU:   Understand these instructions.   Will watch your condition.   Will get help right away if you are not doing well or get worse.  Document Released: 04/26/2000 Document Revised: 07/22/2011 Document Reviewed: 08/20/2007   ExitCare® Patient Information ©2015 [employer]. This information is not  intended to replace advice given to you by your health care provider. Make sure you discuss any questions you have with your health care provider.

## 2014-02-25 NOTE — Addendum Note (Signed)
Addendum created 02/25/14 1241 by Michele Rockers, CRNA   Modules edited: Charges VN

## 2014-02-25 NOTE — Anesthesia Procedure Notes (Signed)
Procedure Name: LMA Insertion Date/Time: 02/25/2014 7:40 AM Performed by: Gershon Mussel, Nameer Summer Pre-anesthesia Checklist: Patient identified, Emergency Drugs available, Suction available, Patient being monitored and Timeout performed Patient Re-evaluated:Patient Re-evaluated prior to inductionOxygen Delivery Method: Circle system utilized Preoxygenation: Pre-oxygenation with 100% oxygen Intubation Type: IV induction LMA: LMA inserted LMA Size: 4.0 Placement Confirmation: positive ETCO2 and breath sounds checked- equal and bilateral

## 2014-02-25 NOTE — Addendum Note (Signed)
Addendum created 02/25/14 1519 by Mickel Baas, CRNA   Modules edited: Charges VN

## 2014-05-13 DIAGNOSIS — G4730 Sleep apnea, unspecified: Secondary | ICD-10-CM

## 2014-05-13 HISTORY — DX: Sleep apnea, unspecified: G47.30

## 2015-10-25 ENCOUNTER — Ambulatory Visit (INDEPENDENT_AMBULATORY_CARE_PROVIDER_SITE_OTHER): Payer: PRIVATE HEALTH INSURANCE | Admitting: Physician Assistant

## 2015-10-25 ENCOUNTER — Encounter: Payer: Self-pay | Admitting: Physician Assistant

## 2015-10-25 VITALS — BP 124/86 | HR 68 | Temp 98.7°F | Resp 18 | Wt 240.0 lb

## 2015-10-25 DIAGNOSIS — T148 Other injury of unspecified body region: Secondary | ICD-10-CM

## 2015-10-25 DIAGNOSIS — L239 Allergic contact dermatitis, unspecified cause: Secondary | ICD-10-CM

## 2015-10-25 DIAGNOSIS — L200 Besnier's prurigo: Secondary | ICD-10-CM

## 2015-10-25 DIAGNOSIS — W57XXXA Bitten or stung by nonvenomous insect and other nonvenomous arthropods, initial encounter: Secondary | ICD-10-CM

## 2015-10-25 MED ORDER — PREDNISONE 20 MG PO TABS: ORAL_TABLET | ORAL | Status: DC

## 2015-10-25 NOTE — Progress Notes (Signed)
    Patient ID: LJ SLIMAN MRN: [id number], DOB: [birth year], 35 y.o. Date of Encounter: 10/25/2015, 2:54 PM    Chief Complaint:  Chief Complaint  Patient presents with  . tick bite 1 week ago    now breaking out, not sure if from tick or poison     HPI: 35 y.o. year old white male presents with above.   Says that Thursday morning he found tic on right low back. Was able to pull it off easily.   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  Says that on Saturday he started to develop rash on right arm, then left arm, then chest/abdoemn.   Wasn't sure if rash related to tic bite in regards to Lyme/RMSF.   Has had no other rash other than the areas he shows me today.  Has had no fever.  Has had no headache.  Has had no increased fatigue/malaise.  Has had no diffuse body aches.     Home Meds:   No outpatient prescriptions prior to visit.   No facility-administered medications prior to visit.    Allergies:  Allergies  Allergen Reactions  . Lipitor [Atorvastatin Calcium] Other (See Comments)    neuralgia  . Sulfur       Review of Systems: See HPI for pertinent ROS. All other ROS negative.    Physical Exam: Blood pressure 124/86, pulse 68, temperature 98.7 F (37.1 C), temperature source Oral, resp. rate 18, weight 240 lb (108.863 kg)., Body mass index is 38.76 kg/(m^2). General:  WNWD WM. Appears in no acute distress. Neck: Supple. No thyromegaly. No lymphadenopathy. Lungs: Clear bilaterally to auscultation without wheezes, rales, or rhonchi. Breathing is unlabored. Heart: Regular rhythm. No murmurs, rubs, or gallops. Msk:  Strength and tone normal for age. Extremities/Skin: Right Low Back: There is ~20mm area at site of prior tic bite. There is pink area surrounding this --the ~80mm area is in center of pink area--pink area diameter ~ 1cm.  Bilateral arms with areas of erythema papules/vessicles/urticaria--some in linear  distribution. Chest, arms with few scattered pink papules.  No other area of rash. No bulls-eye rash to suggest erythema migrans.  Neuro: Alert and oriented X 3. Moves all extremities spontaneously. Gait is normal. CNII-XII grossly in tact. Psych:  Responds to questions appropriately with a normal affect.     ASSESSMENT AND PLAN:  35 y.o. year old male with  1. Tick bite Reassured him that current rash secondary to [employer]. No sign/symptom of Lyme/RMSF.  F/U if develops signs/symptosm or Lyme/RMSF--Reviewed these with him today.   2. Allergic dermatitis He is to take prednisone taper as directed.  F/U if rash does not resolve with this.  Cautioned him of adv effects assoc with prednisone.  - predniSONE (DELTASONE) 20 MG tablet; Take 3 daily for 2 days, then 2 daily for 2 days, then 1 daily for 2 days.  Dispense: 12 tablet; Refill: 0   Signed, [address] [place], [place], [facility] 10/25/2015 2:54 PM

## 2015-10-26 ENCOUNTER — Other Ambulatory Visit: Payer: PRIVATE HEALTH INSURANCE

## 2015-10-26 DIAGNOSIS — Z0000 Encounter for general adult medical examination without abnormal findings: Secondary | ICD-10-CM

## 2015-10-26 LAB — CBC WITH DIFFERENTIAL/PLATELET
BASOS ABS: 0 {cells}/uL (ref 0–200)
Basophils Relative: 0 %
EOS ABS: 128 {cells}/uL (ref 15–500)
Eosinophils Relative: 1 %
HEMATOCRIT: 45.4 % (ref 38.5–50.0)
Hemoglobin: 15.7 g/dL (ref 13.0–17.0)
LYMPHS ABS: 2176 {cells}/uL (ref 850–3900)
Lymphocytes Relative: 17 %
MCH: 31.6 pg (ref 27.0–33.0)
MCHC: 34.6 g/dL (ref 32.0–36.0)
MCV: 91.3 fL (ref 80.0–100.0)
MONO ABS: 640 {cells}/uL (ref 200–950)
MPV: 13.1 fL — ABNORMAL HIGH (ref 7.5–12.5)
Monocytes Relative: 5 %
NEUTROS ABS: 9856 {cells}/uL — AB (ref 1500–7800)
NEUTROS PCT: 77 %
Platelets: 154 10*3/uL (ref 140–400)
RBC: 4.97 MIL/uL (ref 4.20–5.80)
RDW: 14.3 % (ref 11.0–15.0)
WBC: 12.8 10*3/uL — ABNORMAL HIGH (ref 3.8–10.8)

## 2015-10-27 LAB — LIPID PANEL
CHOLESTEROL: 209 mg/dL — AB (ref 125–200)
HDL: 39 mg/dL — ABNORMAL LOW (ref >=40)
LDL Cholesterol: 124 mg/dL (ref <130)
Total CHOL/HDL Ratio: 5.4 Ratio — ABNORMAL HIGH (ref <=5.0)
Triglycerides: 228 mg/dL — ABNORMAL HIGH (ref <150)
VLDL: 46 mg/dL — ABNORMAL HIGH (ref <30)

## 2015-10-27 LAB — COMPREHENSIVE METABOLIC PANEL
ALK PHOS: 52 U/L (ref 40–115)
ALT: 42 U/L (ref 9–46)
AST: 28 U/L (ref 10–40)
Albumin: 4.3 g/dL (ref 3.6–5.1)
BILIRUBIN TOTAL: 0.6 mg/dL (ref 0.2–1.2)
BUN: 16 mg/dL (ref 7–25)
CO2: 24 mmol/L (ref 20–31)
Calcium: 8.9 mg/dL (ref 8.6–10.3)
Chloride: 102 mmol/L (ref 98–110)
Creat: 0.94 mg/dL (ref 0.60–1.35)
Glucose, Bld: 91 mg/dL (ref 70–99)
Potassium: 4.3 mmol/L (ref 3.5–5.3)
SODIUM: 138 mmol/L (ref 135–146)
Total Protein: 7 g/dL (ref 6.1–8.1)

## 2015-11-03 ENCOUNTER — Encounter: Payer: Self-pay | Admitting: Family Medicine

## 2015-11-03 ENCOUNTER — Ambulatory Visit (INDEPENDENT_AMBULATORY_CARE_PROVIDER_SITE_OTHER): Payer: PRIVATE HEALTH INSURANCE | Admitting: Family Medicine

## 2015-11-03 VITALS — BP 112/82 | HR 66 | Temp 98.5°F | Resp 14 | Ht 66.0 in | Wt 238.0 lb

## 2015-11-03 DIAGNOSIS — E669 Obesity, unspecified: Secondary | ICD-10-CM

## 2015-11-03 DIAGNOSIS — Z0000 Encounter for general adult medical examination without abnormal findings: Secondary | ICD-10-CM

## 2015-11-03 MED ORDER — PHENTERMINE HCL 37.5 MG PO CAPS: 37.5000 mg | ORAL_CAPSULE | ORAL | Status: DC

## 2015-11-03 NOTE — Progress Notes (Signed)
Subjective:    Patient ID: Willie Osborne, male    DOB: 10-20-1980, 35 y.o.   MRN: [id number]  HPI Patient is working hard to try to improve his lifestyle. He is exercising 2 days a week. He is trying to watch his diet and is trying to lose weight. He has a very muscular build therefore I do not believe BMI is a good reflection of his obesity. Based on his muscular build, I believe a goal weight for him would be close to 200 pounds. He is interested in medications to assist with weight loss as he has plateaued. Otherwise he is doing well Past Medical History  Diagnosis Date  . Hyperlipidemia    Past Surgical History  Procedure Laterality Date  . Carpal tunnel release Right   . Ear cyst excision N/A 02/25/2014    Procedure: EXCISION CYST, SCROTUM;  Surgeon: Jamesetta So, MD;  Location: [facility];  Service: General;  Laterality: N/A;   No current outpatient prescriptions on file prior to visit.   No current facility-administered medications on file prior to visit.   Allergies  Allergen Reactions  . Lipitor [Atorvastatin Calcium] Other (See Comments)    neuralgia  . Sulfur    Social History   Social History  . Marital Status: Single    Spouse Name: bryanna Broughton  . Number of Children: N/A  . Years of Education: N/A   Occupational History  . farm Catering manager   Social History Main Topics  . Smoking status: Former Smoker -- 0.25 packs/day for 5 years    Types: Cigarettes    Quit date: 11/21/2013  . Smokeless tobacco: Never Used  . Alcohol Use: Yes     Comment: 2-3 beers week  . Drug Use: No  . Sexual Activity: Yes    Birth Control/ Protection: None   Other Topics Concern  . Not on file   Social History Narrative   No family history on file.    Review of Systems  All other systems reviewed and are negative.      Objective:   Physical Exam  Constitutional: He is oriented to person, place, and time. He appears well-developed and  well-nourished. No distress.  HENT:  Head: Normocephalic and atraumatic.  Right Ear: External ear normal.  Left Ear: External ear normal.  Nose: Nose normal.  Mouth/Throat: Oropharynx is clear and moist. No oropharyngeal exudate.  Eyes: Conjunctivae and EOM are normal. Pupils are equal, round, and reactive to light. Right eye exhibits no discharge. Left eye exhibits no discharge. No scleral icterus.  Neck: Normal range of motion. Neck supple. No JVD present. No tracheal deviation present. No thyromegaly present.  Cardiovascular: Normal rate, regular rhythm, normal heart sounds and intact distal pulses.  Exam reveals no gallop and no friction rub.   No murmur heard. Pulmonary/Chest: Effort normal and breath sounds normal. No stridor. No respiratory distress. He has no wheezes. He has no rales. He exhibits no tenderness.  Abdominal: Soft. Bowel sounds are normal. He exhibits no distension and no mass. There is no tenderness. There is no rebound and no guarding.  Musculoskeletal: Normal range of motion. He exhibits no edema or tenderness.  Lymphadenopathy:    He has no cervical adenopathy.  Neurological: He is alert and oriented to person, place, and time. He has normal reflexes. He displays normal reflexes. No cranial nerve deficit. He exhibits normal muscle tone. Coordination normal.  Skin: Skin is warm. No rash  noted. He is not diaphoretic. No erythema. No pallor.  Psychiatric: He has a normal mood and affect. His behavior is normal. Judgment and thought content normal.  Vitals reviewed.         Assessment & Plan:  Obesity - Plan: phentermine 37.5 MG capsule  Gen med exam Visit exam today is completely normal. I recommended 30 minutes a day of aerobic exercise at least 5 days a week. I recommended restricting calories to 1500 cal a day. We will try Adipex 37.5 mg 1 by mouth every morning for 3 months. We discussed the cardiovascular risk of the medication. We also discussed the risk of  dependency. Otherwise he is not due for any cancer screening. He declines a tetanus shot. His lab work is acceptable we'll try to address his low HDL with exercise and his elevated triglycerides with a low carbohydrate, low saturated fat diet

## 2016-07-08 ENCOUNTER — Ambulatory Visit: Payer: PRIVATE HEALTH INSURANCE | Admitting: Family Medicine

## 2016-12-23 ENCOUNTER — Encounter: Payer: Self-pay | Admitting: Family Medicine

## 2016-12-23 ENCOUNTER — Ambulatory Visit (INDEPENDENT_AMBULATORY_CARE_PROVIDER_SITE_OTHER): Payer: PRIVATE HEALTH INSURANCE | Admitting: Family Medicine

## 2016-12-23 VITALS — BP 114/82 | HR 60 | Temp 98.0°F | Resp 18 | Wt 252.0 lb

## 2016-12-23 DIAGNOSIS — E669 Obesity, unspecified: Secondary | ICD-10-CM | POA: Diagnosis not present

## 2016-12-23 DIAGNOSIS — G4710 Hypersomnia, unspecified: Secondary | ICD-10-CM

## 2016-12-23 NOTE — Progress Notes (Signed)
Subjective:    Patient ID: Willie Osborne, male    DOB: 07-Sep-1980, 36 y.o.   MRN: [id number]  HPI  10/2015 Patient is working hard to try to improve his lifestyle. He is exercising 2 days a week. He is trying to watch his diet and is trying to lose weight. He has a very muscular build therefore I do not believe BMI is a good reflection of his obesity. Based on his muscular build, I believe a goal weight for him would be close to 200 pounds. He is interested in medications to assist with weight loss as he has plateaued. Otherwise he is doing well. At that time, my plan was: Visit exam today is completely normal. I recommended 30 minutes a day of aerobic exercise at least 5 days a week. I recommended restricting calories to 1500 cal a day. We will try Adipex 37.5 mg 1 by mouth every morning for 3 months. We discussed the cardiovascular risk of the medication. We also discussed the risk of dependency. Otherwise he is not due for any cancer screening. He declines a tetanus shot. His lab work is acceptable we'll try to address his low HDL with exercise and his elevated triglycerides with a low carbohydrate, low saturated fat diet  12/23/16 Since I last saw the patient, he has gained even more weight. His BMI is now greater than 39. He also reports hypersomnia. He recently went on vacation with his sister and her family. Although they were sleeping in separate rooms, she can hear him snoring through the walls. She also heard him stop breathing on several occasions. She heard him gasp for air. I performed an Epworth Sleepiness Scale today. Patient scored 10 out of 24 indicating a high probability of obstructive sleep apnea. He reports hypersomnia during the day. He never feels rested. He feels too tired to exercise. No matter how many hours he sleeps, he never feels like he received a good night sleep. He reports a high likelihood of falling asleep after eating lunch, riding in a car, watching TV. He  reports slightly increased risk of falling asleep reading.  ] Past Medical History:  Diagnosis Date  . Hyperlipidemia    Past Surgical History:  Procedure Laterality Date  . CARPAL TUNNEL RELEASE Right   . EAR CYST EXCISION N/A 02/25/2014   Procedure: EXCISION CYST, SCROTUM;  Surgeon: Jamesetta So, MD;  Location: [facility];  Service: General;  Laterality: N/A;   Current Outpatient Prescriptions on File Prior to Visit  Medication Sig Dispense Refill  . phentermine 37.5 MG capsule Take 1 capsule (37.5 mg total) by mouth every morning. 30 capsule 2   No current facility-administered medications on file prior to visit.    Allergies  Allergen Reactions  . Lipitor [Atorvastatin Calcium] Other (See Comments)    neuralgia  . Sulfur    Social History   Social History  . Marital status: Single    Spouse name: bryanna Hudgins  . Number of children: N/A  . Years of education: N/A   Occupational History  . farm Catering manager   Social History Main Topics  . Smoking status: Former Smoker    Packs/day: 0.25    Years: 5.00    Types: Cigarettes    Quit date: 11/21/2013  . Smokeless tobacco: Never Used  . Alcohol use Yes     Comment: 2-3 beers week  . Drug use: No  . Sexual activity: Yes  Birth control/ protection: None   Other Topics Concern  . Not on file   Social History Narrative  . No narrative on file   No family history on file.    Review of Systems  All other systems reviewed and are negative.      Objective:   Physical Exam  Constitutional: He is oriented to person, place, and time. He appears well-developed and well-nourished. No distress.  HENT:  Head: Normocephalic and atraumatic.  Right Ear: External ear normal.  Left Ear: External ear normal.  Nose: Nose normal.  Mouth/Throat: Oropharynx is clear and moist. No oropharyngeal exudate.  Eyes: Pupils are equal, round, and reactive to light. Conjunctivae and EOM are normal. Right eye exhibits  no discharge. Left eye exhibits no discharge. No scleral icterus.  Neck: Normal range of motion. Neck supple. No JVD present. No tracheal deviation present. No thyromegaly present.  Cardiovascular: Normal rate, regular rhythm, normal heart sounds and intact distal pulses.  Exam reveals no gallop and no friction rub.   No murmur heard. Pulmonary/Chest: Effort normal and breath sounds normal. No stridor. No respiratory distress. He has no wheezes. He has no rales. He exhibits no tenderness.  Abdominal: Soft. Bowel sounds are normal. He exhibits no distension and no mass. There is no tenderness. There is no rebound and no guarding.  Musculoskeletal: Normal range of motion. He exhibits no edema or tenderness.  Lymphadenopathy:    He has no cervical adenopathy.  Neurological: He is alert and oriented to person, place, and time. He has normal reflexes. No cranial nerve deficit. He exhibits normal muscle tone. Coordination normal.  Skin: Skin is warm. No rash noted. He is not diaphoretic. No erythema. No pallor.  Psychiatric: He has a normal mood and affect. His behavior is normal. Judgment and thought content normal.  Vitals reviewed.         Assessment & Plan:  Hypersomnolence - Plan: Ambulatory referral to Sleep Studies  Class 2 obesity in adult, unspecified BMI, unspecified obesity type, unspecified whether serious comorbidity present - Plan: Ambulatory referral to Sleep Studies Patient certainly sounds to be at high risk for obstructive sleep apnea. I will consult [facility] for a sleep study to evaluate further. Continue to encourage therapeutic lifestyle changes to achieve weight loss including a less than 1500-calorie a day diet and 30 minutes of aerobic exercise daily.

## 2017-01-22 ENCOUNTER — Ambulatory Visit (INDEPENDENT_AMBULATORY_CARE_PROVIDER_SITE_OTHER): Payer: PRIVATE HEALTH INSURANCE | Admitting: Neurology

## 2017-01-22 ENCOUNTER — Encounter: Payer: Self-pay | Admitting: Neurology

## 2017-01-22 VITALS — BP 130/86 | HR 65 | Ht 66.0 in | Wt 247.0 lb

## 2017-01-22 DIAGNOSIS — G4730 Sleep apnea, unspecified: Secondary | ICD-10-CM

## 2017-01-22 DIAGNOSIS — E669 Obesity, unspecified: Secondary | ICD-10-CM

## 2017-01-22 DIAGNOSIS — Z6839 Body mass index (BMI) 39.0-39.9, adult: Secondary | ICD-10-CM

## 2017-01-22 DIAGNOSIS — G4710 Hypersomnia, unspecified: Secondary | ICD-10-CM | POA: Diagnosis not present

## 2017-01-22 DIAGNOSIS — R0683 Snoring: Secondary | ICD-10-CM

## 2017-01-22 NOTE — Progress Notes (Signed)
SLEEP MEDICINE CLINIC   Provider:  Larey Seat, [place] D  Primary Care Physician:  Susy Frizzle, MD   Referring Provider: Susy Frizzle, MD    Chief Complaint  Patient presents with  . New Patient (Initial Visit)    pt alone, room 10, pt has been having difficulty with daytime sleepiness and also with snoring. pt  was also told he would stop breathing in his sleep    HPI:  Willie Osborne is a 36 y.o. male , seen here as in a referral  from Dr. Dennard Schaumann for a sleep apnea evaluation.   Willie Osborne presents here as a 36 year old Caucasian right-handed gentleman and patient of Dr. Dennard Schaumann. He reports snoring, fatigue excessive daytime sleepiness and weight gain. The patient has been working hard to improve his lifestyle over the last 18 months or so he begun exercising 2 days a week begun watching his diet. He is of muscular build and Dr. Lelon Frohlich mentioned that he does not believe the BMI is a good reflection of his obesity. He has tried restricting calories and tried Adipex medication at 37.5 mg once daily for 3 months. He still did not lose the weight that he intended to lose. He also reports that but he doesn't sleep well he compensates with high sugar drinks to give him energy and daytime. He recently went on a vacation with his sister and her family and she could hear him snoring through the walls.She also witnessed apnea.   Sleep habits are as follows: Bedtime is 10-11 Pm, and the last hour before bed time is spent watching TV in the den.  The patient describes his bedroom as cool, quiet and keep a light in the bedroom.  He sleeps in the same bed as his dog ( a 20 pound dog). He sleeps on a flat surface with 2 pillows usually on his side. He can sleep through for about 6-7 hours and has no nocturia. In the morning he wakes up usually spontaneously before the alarm rings at about 5 AM. He rarely dreams. He takes some afternoon naps- 30-60 minutes with dreaming- he recalls those.  He feels not refreshed form nocturnal sleep, naps do. He naps in a recliner.    Sleep medical history and family sleep history:  No known family history of sleep apnea.  Sleep walking history.   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 Caffeine intake  Soda one a week , arnold palmer for supper, 1-2 cups of coffee a week. Alcohol: 1 a week, Tobacco - none.   Review of Systems: Out of a complete 14 system review, the patient complains of only the following symptoms, and all other reviewed systems are negative. Snoring thunderously, having non refreshing, non restorative sleep- no matter how long he sleep.   Epworth score 13 , Fatigue severity score 50 , depression score n/a    Social History   Social History  . Marital status: Single    Spouse name: bryanna Umbach  . Number of children: N/A  . Years of education: N/A   Occupational History  . farm Catering manager   Social History Main Topics  . Smoking status: Former Smoker    Packs/day: 0.25    Years: 5.00    Types: Cigarettes    Quit date: 11/21/2013  . Smokeless tobacco: Never Used  . Alcohol use Yes     Comment: 2-3 beers week  .  Drug use: No  . Sexual activity: Yes    Birth control/ protection: None   Other Topics Concern  . Not on file   Social History Narrative  . No narrative on file    No family history on file.  Past Medical History:  Diagnosis Date  . Hyperlipidemia     Past Surgical History:  Procedure Laterality Date  . CARPAL TUNNEL RELEASE Right   . EAR CYST EXCISION N/A 02/25/2014   Procedure: EXCISION CYST, SCROTUM;  Surgeon: Jamesetta So, MD;  Location: [facility];  Service: General;  Laterality: N/A;    No current outpatient prescriptions on file.   No current facility-administered medications for this visit.     Allergies as of 01/22/2017 - Review Complete 01/22/2017  Allergen Reaction Noted  . Lipitor [atorvastatin calcium]  Other (See Comments) 08/07/2010  . Sulfur  08/07/2010    Vitals: BP 130/86   Pulse 65   Ht 5\' 6"  (1.676 m)   Wt 247 lb (112 kg)   BMI 39.87 kg/m  Last Weight:  Wt Readings from Last 1 Encounters:  01/22/17 247 lb (112 kg)   WUJ:WJXB mass index is 39.87 kg/m.     Last Height:   Ht Readings from Last 1 Encounters:  01/22/17 5\' 6"  (1.676 m)    Physical exam:  General: The patient is awake, alert and appears not in acute distress. The patient is well groomed. Head: Normocephalic, atraumatic. Neck is supple. Mallampati 5,  neck circumference: 19 . Nasal airflow patent , Retrognathia is not seen.  Cardiovascular:  Regular rate and rhythm, without  murmurs or carotid bruit, and without distended neck veins. Respiratory: Lungs are clear to auscultation. Skin:  Without evidence of edema, or rash Trunk: BMI is 40.  The patient's posture is erect.  Neurologic exam : The patient is awake and alert, oriented to place and time.   Memory subjective described as intact.   Attention span & concentration ability appears normal.  Speech is fluent,  Without dysarthria, dysphonia or aphasia.  Mood and affect are appropriate.  Cranial nerves: reports no changes in taste and smell. Pupils are equal and briskly reactive to light. Extraocular movements  in vertical and horizontal planes intact and without nystagmus. Visual fields by finger perimetry are intact.Hearing to finger rub intact.  Facial sensation intact to fine touch. Facial motor strength is symmetric and tongue and uvula move midline. Shoulder shrug was symmetrical.   Motor exam: Normal tone,  Large muscle bulk and symmetric strength in all extremities. Sensory:  Fine touch, pinprick and vibration were tested in all extremities. Proprioception tested in the upper extremities was normal. Coordination:  Finger-to-nose maneuver  normal without evidence of ataxia, dysmetria or tremor. Gait and station: Patient walks without assistive  device Tandem gait is unfragmented. Turns with 3 steps- .  Deep tendon reflexes: in the  upper and lower extremities are symmetric and intact.    Assessment:  After physical and neurologic examination, review of laboratory studies,  Personal review of imaging studies, reports of other /same  Imaging studies, results of polysomnography and / or neurophysiology testing and pre-existing records as far as provided in visit., my assessment is   1) extremely low uvula and macroglossia- very narrow airway, short and thick neck. Large barrel chest.  Willie Osborne has several nonmodifiable risk factors for sleep apnea and thunderous snoring. In addition his BMI has now reached 40, placing him in a additional higher risk category. I am concerned that  his daytime fatigue and excessive daytime sleepiness are a reflection of underlying sleep apnea and may be hypoxemia at night. He does not report morning headaches. He reports that it doesn't matter if he sleeps for 8 hours and that sleep just does not refresh and restore.   There is a strong history of CHF,  Possible relation to untreated OSA: paternal grandfather and both maternal grandparents. His father snores.   He sleeps through the night without nocturia but he does wake up with a dry mouth. I would prefer and attended sleep study to evaluate Willie Osborne and be able to treat him the same night if apnea is documented. I will not measure hypercapnia but we will keep the hypoxemia watch. I would like to meet with the patient after the sleep test have been completed.    The patient was advised of the nature of the diagnosed disorder , the treatment options and the  risks for general health and wellness arising from not treating the condition.   I spent more than 45 minutes of face to face time with the patient.  Greater than 50% of time was spent in counseling and coordination of care. We have discussed the diagnosis and differential and I answered the patient's  questions.    Plan:  Treatment plan and additional workup :  SPLIT night PSG- AHI of 20, 3 %.   Larey Seat, MD 05/18/2692, 8:54 AM  Certified in Neurology by ABPN Certified in [place] by [facility] [address], [place], [place] [address]

## 2017-01-22 NOTE — Patient Instructions (Signed)

## 2017-03-05 ENCOUNTER — Encounter (INDEPENDENT_AMBULATORY_CARE_PROVIDER_SITE_OTHER): Payer: Self-pay

## 2017-03-05 ENCOUNTER — Ambulatory Visit: Payer: PRIVATE HEALTH INSURANCE | Admitting: Neurology

## 2017-03-05 DIAGNOSIS — G4710 Hypersomnia, unspecified: Secondary | ICD-10-CM | POA: Diagnosis not present

## 2017-03-05 DIAGNOSIS — R0683 Snoring: Secondary | ICD-10-CM

## 2017-03-05 DIAGNOSIS — G4730 Sleep apnea, unspecified: Secondary | ICD-10-CM

## 2017-03-11 ENCOUNTER — Telehealth: Payer: Self-pay | Admitting: Neurology

## 2017-03-11 DIAGNOSIS — G4730 Sleep apnea, unspecified: Secondary | ICD-10-CM

## 2017-03-11 DIAGNOSIS — G4710 Hypersomnia, unspecified: Secondary | ICD-10-CM

## 2017-03-11 DIAGNOSIS — R0683 Snoring: Secondary | ICD-10-CM

## 2017-03-11 NOTE — Procedures (Signed)
[facility]  [facility] [address]. [place], [place] [address] NAME:    Willie Osborne                                                         DOB: 1980-05-19 MEDICAL RECORD NUMBER  [id number]                                                      DOS: 03/05/2017 REFERRING PHYSICIAN: Jenna Luo, M.D.  STUDY PERFORMED: Home Sleep Study HISTORY: [name] presents here as a 36 year old Caucasian right-handed gentleman and patient of Dr. Dennard Schaumann. He reports snoring, fatigue excessive daytime sleepiness and weight gain. The patient has been working hard to improve his lifestyle over the last 18 months - he begun exercising 2 days a week, begun watching his diet. He is of muscular build and Dr. Lelon Frohlich mentioned that he does not believe the BMI is a good reflection of his obesity. He has tried restricting calories and tried Adipex medication at 37.5 mg once daily for 3 months. He still did not lose the weight that he intended to lose. He also reports that but he doesn't sleep well he compensates with high sugar drinks to give him energy in daytime. He recently went on a vacation with his sister and her family and she could hear him snoring through the walls. She also witnessed apnea. Epworth Sleepiness score 13, Fatigue severity score 50, BMI 40.      STUDY RESULTS: Total Recording: 7 hours, 44 minutes Total Apnea/Hypopnea Index (AHI): 7.2 /hour, RDI was 8.3/hr.  Average Oxygen Saturation:  SpO2 96 % Lowest Oxygen Saturation: 81%; Time Oxygen Saturation Below 89%: 2 minutes. Average Heart Rate: 60 bpm, from 47 to 102 bpm.       IMPRESSION:  Mild Sleep Apnea, without tachy-bradycardia, and without hypoxemia. Snoring is present (RDI reflects non apnea respiratory arousals) RECOMMENDATION: This mild apnea does not require CPAP intervention. I will arrange for a SPLIT PSG study, if the patient feels this HST result does not reflect his true degree of sleep disordered breathing.   I certify that I have reviewed the raw data recording prior to the issuance of this report in accordance with the standards of the American Academy of Sleep Medicine (AASM). Larey Seat, M.D.      03-11-2018  Medical Director of [facility] [place] [facility] [place], accredited by the AASM

## 2017-03-12 ENCOUNTER — Telehealth: Payer: Self-pay | Admitting: Neurology

## 2017-03-12 NOTE — Telephone Encounter (Signed)
Called the patient to make him aware that the HST was read by Dr Brett Fairy. Dr Brett Fairy stated that he has mild apnea and snoring that was present. She is recommending the patient come into the sleep lab for a split night sleep study. Pt was nervous about if insurance would approve. I explained that before booking we have to run it thru insurance and we will hear if they will approve or deny. Once we get that information we will contact the patient and let him know what insurance covers. Pt was agreeable to doing this and was appreciative for the call and information. Pt verbalized understanding.

## 2017-03-12 NOTE — Telephone Encounter (Signed)
-----   Message from Larey Seat, MD sent at 03/11/2017  5:42 PM EDT ----- HST result- This mild apnea does not require CPAP intervention. There was mild to moderate snoring only ( RDI ) . I will arrange for a SPLIT PSG study, since this HST's result does not reflect his true degree of sleep disordered breathing and snoring ( per history ) .

## 2017-03-13 ENCOUNTER — Encounter: Payer: Self-pay | Admitting: Neurology

## 2017-03-13 NOTE — Telephone Encounter (Signed)
[facility]  [facility] [address]. [place],  [address] NAME:    Willie Osborne                                                         DOB: [birth year] MEDICAL RECORD NUMBER  [id number]                                                      DOS: 03/05/2017 REFERRING PHYSICIAN: Jenna Luo, M.D.  STUDY PERFORMED: Home Sleep Study HISTORY: Mr. Fitzner presents here as a 36 year old Caucasian right-handed gentleman and patient of Dr. Dennard Schaumann. He reports snoring, fatigue excessive daytime sleepiness and weight gain. The patient has been working hard to improve his lifestyle over the last 18 months - he begun exercising 2 days a week, begun watching his diet. He recently went on a vacation with his sister and her family and she could hear him snoring through the walls. She also witnessed apnea. Epworth Sleepiness score 13, Fatigue severity score 50, BMI 40.      STUDY RESULTS: Total Recording: 7 hours, 44 minutes Total Apnea/Hypopnea Index (AHI): 7.2 /hour, RDI was 8.3/hr.  Average Oxygen Saturation:  SpO2 96 % Lowest Oxygen Saturation: 81%; Time Oxygen Saturation Below 89%: 2 minutes. Average Heart Rate: 60 bpm, from 47 to 102 bpm.       IMPRESSION:  Mild Sleep Apnea, without tachy-bradycardia, and without hypoxemia. Snoring is present (RDI reflects non apnea respiratory arousals) RECOMMENDATION: This mild apnea does not require CPAP intervention. I will arrange for a SPLIT PSG study, if the patient feels this HST result does not reflect his true degree of sleep disordered breathing.  I certify that I have reviewed the raw data recording prior to the issuance of this report in accordance with the standards of the American Academy of Sleep Medicine (AASM). Larey Seat, M.D.      03-11-2018  Medical Director of [facility] [place] [facility] [place], accredited by the AASM

## 2017-04-09 ENCOUNTER — Ambulatory Visit (INDEPENDENT_AMBULATORY_CARE_PROVIDER_SITE_OTHER): Payer: PRIVATE HEALTH INSURANCE | Admitting: Family Medicine

## 2017-04-09 DIAGNOSIS — Z23 Encounter for immunization: Secondary | ICD-10-CM | POA: Diagnosis not present

## 2017-05-23 ENCOUNTER — Telehealth: Payer: Self-pay

## 2017-05-23 NOTE — Telephone Encounter (Signed)
Patient called requesting rx for pain medication due to arm pain. Patient states he has been cutting firewood and now it hurts for him to bend his right forearm. Patient states his elbow is sore to the touch and notices whenever he makes a fist he has tightness in forearm.   Patient was instructed to go to urgent care as he would need an appointment to be seen before a rx could be given. Patient then hung up before Ii could finish my statement

## 2017-06-09 ENCOUNTER — Ambulatory Visit (INDEPENDENT_AMBULATORY_CARE_PROVIDER_SITE_OTHER): Payer: PRIVATE HEALTH INSURANCE | Admitting: Family Medicine

## 2017-06-09 ENCOUNTER — Encounter: Payer: Self-pay | Admitting: Family Medicine

## 2017-06-09 VITALS — BP 120/84 | HR 58 | Temp 98.1°F | Resp 14 | Ht 66.0 in | Wt 244.0 lb

## 2017-06-09 DIAGNOSIS — M7711 Lateral epicondylitis, right elbow: Secondary | ICD-10-CM | POA: Diagnosis not present

## 2017-06-09 MED ORDER — DICLOFENAC SODIUM 75 MG PO TBEC: 75.0000 mg | DELAYED_RELEASE_TABLET | Freq: Two times a day (BID) | ORAL | 0 refills | Status: DC

## 2017-06-09 NOTE — Progress Notes (Signed)
   Subjective:    Patient ID: Willie Osborne, male    DOB: [birth year], 37 y.o.   MRN: [id number]  HPI He reports 2 months of pain in the right lateral epicondyle.  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  Has tried waiting and naprosyn without benefit.  Tender over the lateral epicondyle with resisted wrist dorsiflexion and hand gripping motion.   Past Medical History:  Diagnosis Date  . Hyperlipidemia    Past Surgical History:  Procedure Laterality Date  . CARPAL TUNNEL RELEASE Right   . EAR CYST EXCISION N/A 02/25/2014   Procedure: EXCISION CYST, SCROTUM;  Surgeon: Jamesetta So, MD;  Location: [facility];  Service: General;  Laterality: N/A;   No current outpatient medications on file prior to visit.   No current facility-administered medications on file prior to visit.    Allergies  Allergen Reactions  . Lipitor [Atorvastatin Calcium] Other (See Comments)    neuralgia  . Sulfur    Social History   Socioeconomic History  . Marital status: Single    Spouse name: bryanna Sheahan  . Number of children: Not on file  . Years of education: Not on file  . Highest education level: Not on file  Social Needs  . Financial resource strain: Not on file  . Food insecurity - worry: Not on file  . Food insecurity - inability: Not on file  . Transportation needs - medical: Not on file  . Transportation needs - non-medical: Not on file  Occupational History  . Occupation: farm Horticulturist, commercial    Comment: Medical illustrator  Tobacco Use  . Smoking status: Former Smoker    Packs/day: 0.25    Years: 5.00    Pack years: 1.25    Types: Cigarettes    Last attempt to quit: 11/21/2013    Years since quitting: 3.5  . Smokeless tobacco: Never Used  Substance and Sexual Activity  . Alcohol use: Yes    Comment: 2-3 beers week  . Drug use: No  . Sexual activity: Yes    Birth control/protection: None  Other Topics Concern  . Not on file  Social History Narrative  . Not on file       Review of Systems  All other systems reviewed and are negative.      Objective:   Physical Exam  Constitutional: He appears well-developed and well-nourished.  Cardiovascular: Normal rate, regular rhythm and normal heart sounds.  Pulmonary/Chest: Effort normal and breath sounds normal. No respiratory distress. He has no wheezes. He has no rales.  Musculoskeletal:       Right elbow: He exhibits normal range of motion, no swelling, no effusion and no deformity. Tenderness found. Lateral epicondyle tenderness noted.  Vitals reviewed.         Assessment & Plan:  Lateral epicondylitis of right elbow - Plan: DG Elbow Complete Right, diclofenac (VOLTAREN) 75 MG EC tablet  Voltaren 75 mg pobid for two weeks, wear elbow compression strap, and wrist splint to prevent dorsiflexion.  Recheck in 3-4 weeks, cortisone injection if still painful.  Obtain xray of elbow if no better in 2-3 weeks.

## 2017-07-07 ENCOUNTER — Telehealth: Payer: Self-pay | Admitting: Family Medicine

## 2017-07-07 NOTE — Telephone Encounter (Signed)
[phone] Patient is calling to saying he would like to get xray of his finger that has possible tendinitis in it, I do not see order could an order be put in for this if possible

## 2017-07-08 ENCOUNTER — Encounter: Payer: Self-pay | Admitting: Family Medicine

## 2017-07-08 ENCOUNTER — Ambulatory Visit
Admission: RE | Admit: 2017-07-08 | Discharge: 2017-07-08 | Disposition: A | Discharge status: Home / Self Care | Payer: No Typology Code available for payment source | Source: Ambulatory Visit | Attending: Family Medicine | Admitting: Family Medicine

## 2017-07-08 ENCOUNTER — Ambulatory Visit (INDEPENDENT_AMBULATORY_CARE_PROVIDER_SITE_OTHER): Payer: PRIVATE HEALTH INSURANCE | Admitting: Family Medicine

## 2017-07-08 DIAGNOSIS — M7711 Lateral epicondylitis, right elbow: Secondary | ICD-10-CM

## 2017-07-08 MED ORDER — DICLOFENAC SODIUM 75 MG PO TBEC: 75.0000 mg | DELAYED_RELEASE_TABLET | Freq: Two times a day (BID) | ORAL | 0 refills | Status: DC

## 2017-07-08 NOTE — Progress Notes (Signed)
Subjective:    Patient ID: Willie Osborne, male    DOB: May 18, 1980, 37 y.o.   MRN: [id number]  HPI 06/09/17  He reports 2 months of pain in the right lateral epicondyle.  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  Has tried waiting and naprosyn without benefit.  Tender over the lateral epicondyle with resisted wrist dorsiflexion and hand gripping motion.  At that time, my plan was: Voltaren 75 mg pobid for two weeks, wear elbow compression strap, and wrist splint to prevent dorsiflexion.  Recheck in 3-4 weeks, cortisone injection if still painful.  Obtain xray of elbow if no better in 2-3 weeks.   07/08/17 Patient is doing much better however he continues to have pain over the lateral epicondyle.  He is not wearing a wrist splint but he is compliant with the elbow strap.  He is requesting a cortisone injection in his elbow so that he can split more firewood this weekend and actually work harder. Past Medical History:  Diagnosis Date  . Hyperlipidemia    Past Surgical History:  Procedure Laterality Date  . CARPAL TUNNEL RELEASE Right   . EAR CYST EXCISION N/A 02/25/2014   Procedure: EXCISION CYST, SCROTUM;  Surgeon: Jamesetta So, MD;  Location: [facility];  Service: General;  Laterality: N/A;   No current outpatient medications on file prior to visit.   No current facility-administered medications on file prior to visit.    Allergies  Allergen Reactions  . Lipitor [Atorvastatin Calcium] Other (See Comments)    neuralgia  . Sulfur    Social History   Socioeconomic History  . Marital status: Single    Spouse name: bryanna Liguori  . Number of children: Not on file  . Years of education: Not on file  . Highest education level: Not on file  Social Needs  . Financial resource strain: Not on file  . Food insecurity - worry: Not on file  . Food insecurity - inability: Not on file  . Transportation needs - medical: Not on file  . Transportation needs -  non-medical: Not on file  Occupational History  . Occupation: farm Horticulturist, commercial    Comment: Medical illustrator  Tobacco Use  . Smoking status: Former Smoker    Packs/day: 0.25    Years: 5.00    Pack years: 1.25    Types: Cigarettes    Last attempt to quit: 11/21/2013    Years since quitting: 3.6  . Smokeless tobacco: Never Used  Substance and Sexual Activity  . Alcohol use: Yes    Comment: 2-3 beers week  . Drug use: No  . Sexual activity: Yes    Birth control/protection: None  Other Topics Concern  . Not on file  Social History Narrative  . Not on file      Review of Systems  All other systems reviewed and are negative.      Objective:   Physical Exam  Constitutional: He appears well-developed and well-nourished.  Cardiovascular: Normal rate, regular rhythm and normal heart sounds.  Pulmonary/Chest: Effort normal and breath sounds normal. No respiratory distress. He has no wheezes. He has no rales.  Musculoskeletal:       Right elbow: He exhibits normal range of motion, no swelling, no effusion and no deformity. Tenderness found. Lateral epicondyle tenderness noted.  Vitals reviewed.         Assessment & Plan:  Lateral epicondylitis of right elbow - Plan: diclofenac (VOLTAREN) 75 MG EC tablet I explained  to the patient that cortisone injections theoretically weaken the tendon and the tendon attached to the lateral epicondyle is already inflamed causing his pain.  Using a cortisone injection and then not resting increases the likelihood of tendon rupture.  I recommended against a cortisone injection.  My advice would be to pursue the cortisone injection and then to rest for 1-2 weeks allowing the inflammation to subside.  Patient elects to forego the cortisone injection because of the work he is planning to do over the next 2 weeks continue elbow strap and start wearing wrist splint as he has seen benefit and continue to take diclofenac 75 mg p.o. twice daily.  If no better, he  will consent to the cortisone injection and rest

## 2017-07-08 NOTE — Telephone Encounter (Signed)
LMTRC

## 2017-07-08 NOTE — Telephone Encounter (Signed)
Pt aware and apt made for cortisone inj and pt will go get xray this am - order already in epic

## 2017-07-10 ENCOUNTER — Ambulatory Visit: Payer: PRIVATE HEALTH INSURANCE | Admitting: Family Medicine

## 2017-08-21 ENCOUNTER — Encounter: Payer: Self-pay | Admitting: Family Medicine

## 2017-08-21 ENCOUNTER — Other Ambulatory Visit: Payer: Self-pay

## 2017-08-21 ENCOUNTER — Ambulatory Visit (INDEPENDENT_AMBULATORY_CARE_PROVIDER_SITE_OTHER): Payer: PRIVATE HEALTH INSURANCE | Admitting: Family Medicine

## 2017-08-21 VITALS — BP 126/70 | HR 70 | Temp 98.0°F | Resp 12 | Ht 66.0 in | Wt 252.0 lb

## 2017-08-21 DIAGNOSIS — M7711 Lateral epicondylitis, right elbow: Secondary | ICD-10-CM

## 2017-08-21 NOTE — Progress Notes (Signed)
Subjective:    Patient ID: Willie Osborne, male    DOB: 03-Nov-1980, 37 y.o.   MRN: [id number]  HPI1/28/19  He reports 2 months of pain in the right lateral epicondyle.  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  Has tried waiting and naprosyn without benefit.  Tender over the lateral epicondyle with resisted wrist dorsiflexion and hand gripping motion.  At that time, my plan was: Voltaren 75 mg pobid for two weeks, wear elbow compression strap, and wrist splint to prevent dorsiflexion.  Recheck in 3-4 weeks, cortisone injection if still painful.  Obtain xray of elbow if no better in 2-3 weeks.   07/08/17 Patient is doing much better however he continues to have pain over the lateral epicondyle.  He is not wearing a wrist splint but he is compliant with the elbow strap.  He is requesting a cortisone injection in his elbow so that he can split more firewood this weekend and actually work harder.  At that time, my plan was: I explained to the patient that cortisone injections theoretically weaken the tendon and the tendon attached to the lateral epicondyle is already inflamed causing his pain.  Using a cortisone injection and then not resting increases the likelihood of tendon rupture.  I recommended against a cortisone injection.  My advice would be to pursue the cortisone injection and then to rest for 1-2 weeks allowing the inflammation to subside.  Patient elects to forego the cortisone injection because of the work he is planning to do over the next 2 weeks continue elbow strap and start wearing wrist splint as he has seen benefit and continue to take diclofenac 75 mg p.o. twice daily.  If no better, he will consent to the cortisone injection and rest  08/21/17 Patient is here today requesting the cortisone injection in his right elbow.  He is still tender to palpation over the lateral epicondyle and just distal to the lateral epicondyle.  This is made worse when he has to use  grip strength such as shaking hands or using tools.  It also hurts with dorsiflexion of the wrist.  It was improving with rest and medication however he recently worsened after the patient shook someone's hand. Past Medical History:  Diagnosis Date  . Hyperlipidemia    Past Surgical History:  Procedure Laterality Date  . CARPAL TUNNEL RELEASE Right   . EAR CYST EXCISION N/A 02/25/2014   Procedure: EXCISION CYST, SCROTUM;  Surgeon: Jamesetta So, MD;  Location: [facility];  Service: General;  Laterality: N/A;   Current Outpatient Medications on File Prior to Visit  Medication Sig Dispense Refill  . diclofenac (VOLTAREN) 75 MG EC tablet Take 1 tablet (75 mg total) by mouth 2 (two) times daily. 60 tablet 0   No current facility-administered medications on file prior to visit.    Allergies  Allergen Reactions  . Lipitor [Atorvastatin Calcium] Other (See Comments)    neuralgia  . Sulfur    Social History   Socioeconomic History  . Marital status: Single    Spouse name: bryanna Crafton  . Number of children: Not on file  . Years of education: Not on file  . Highest education level: Not on file  Occupational History  . Occupation: farm Horticulturist, commercial    Comment: Medical illustrator  Social Needs  . Financial resource strain: Not on file  . Food insecurity:    Worry: Not on file    Inability: Not on file  .  Transportation needs:    Medical: Not on file    Non-medical: Not on file  Tobacco Use  . Smoking status: Former Smoker    Packs/day: 0.25    Years: 5.00    Pack years: 1.25    Types: Cigarettes    Last attempt to quit: 11/21/2013    Years since quitting: 3.7  . Smokeless tobacco: Never Used  Substance and Sexual Activity  . Alcohol use: Yes    Comment: 2-3 beers week  . Drug use: No  . Sexual activity: Yes    Birth control/protection: None  Lifestyle  . Physical activity:    Days per week: Not on file    Minutes per session: Not on file  . Stress: Not on file  Relationships    . Social connections:    Talks on phone: Not on file    Gets together: Not on file    Attends religious service: Not on file    Active member of club or organization: Not on file    Attends meetings of clubs or organizations: Not on file    Relationship status: Not on file  . Intimate partner violence:    Fear of current or ex partner: Not on file    Emotionally abused: Not on file    Physically abused: Not on file    Forced sexual activity: Not on file  Other Topics Concern  . Not on file  Social History Narrative  . Not on file      Review of Systems  All other systems reviewed and are negative.      Objective:   Physical Exam  Constitutional: He appears well-developed and well-nourished.  Cardiovascular: Normal rate, regular rhythm and normal heart sounds.  Pulmonary/Chest: Effort normal and breath sounds normal. No respiratory distress. He has no wheezes. He has no rales.  Musculoskeletal:       Right elbow: He exhibits normal range of motion, no swelling, no effusion and no deformity. Tenderness found. Lateral epicondyle tenderness noted.  Vitals reviewed.         Assessment & Plan:  Lateral epicondylitis of right elbow  Using sterile technique, I injected 1/2 cc of 40 mg/mL Kenalog and 1/2 cc of 0.1% lidocaine adjacent to the right lateral epicondyle and the flexor tendon sheath near the point of maximum tenderness.  Patient tolerated the procedure well without complication.  Recommended relative rest.  If no improvement is seen after 1 week, I recommended orthopedic consultation due to failure of conservative therapy

## 2017-08-25 ENCOUNTER — Other Ambulatory Visit: Payer: Self-pay | Admitting: Family Medicine

## 2017-09-25 ENCOUNTER — Ambulatory Visit: Payer: PRIVATE HEALTH INSURANCE | Admitting: Family Medicine

## 2017-09-25 ENCOUNTER — Encounter: Payer: Self-pay | Admitting: Family Medicine

## 2017-09-25 VITALS — BP 136/90 | HR 64 | Temp 98.0°F | Resp 18 | Ht 66.0 in | Wt 254.0 lb

## 2017-09-25 DIAGNOSIS — Z713 Dietary counseling and surveillance: Secondary | ICD-10-CM

## 2017-09-25 MED ORDER — PHENTERMINE HCL 37.5 MG PO TABS
37.5000 mg | ORAL_TABLET | Freq: Every day | ORAL | 2 refills | Status: DC
Start: 2017-09-25 — End: 2022-06-12

## 2017-09-25 NOTE — Progress Notes (Signed)
Subjective:    Patient ID: Willie Osborne, male    DOB: 10-Jun-1980, 37 y.o.   MRN: [id number]  HPI Patient is requesting assistance with weight loss.  He is used Adipex in the past and had success while on the medication however he gained the weight back since he discontinued the medication.  He is wanting to institute therapeutic lifestyle changes including a diet and exercise program.  He just started going to the gym.  He is try to do 30 minutes of cardio every day.  He is also interested in starting the Mediterranean diet.  However his biggest problem has been his appetite.  When he first starts a diet, he constantly feels hungry.  Frequently he will indulge and overeat.  He then quickly gives up.  He believes if he could use a drug to help suppress his appetite, he would be able to successfully start the diet and then after 2 or 3 months, he believes he can maintain the diet on his own.  His insurance will not cover any type of weight loss medication.  Therefore cost is an issue for the patient. Past Medical History:  Diagnosis Date  . Hyperlipidemia    Past Surgical History:  Procedure Laterality Date  . CARPAL TUNNEL RELEASE Right   . EAR CYST EXCISION N/A 02/25/2014   Procedure: EXCISION CYST, SCROTUM;  Surgeon: Jamesetta So, MD;  Location: [facility];  Service: General;  Laterality: N/A;   No current outpatient medications on file prior to visit.   No current facility-administered medications on file prior to visit.    Allergies  Allergen Reactions  . Lipitor [Atorvastatin Calcium] Other (See Comments)    neuralgia  . Sulfur    Social History   Socioeconomic History  . Marital status: Single    Spouse name: bryanna Marquard  . Number of children: Not on file  . Years of education: Not on file  . Highest education level: Not on file  Occupational History  . Occupation: farm Horticulturist, commercial    Comment: Medical illustrator  Social Needs  . Financial resource strain: Not on file  .  Food insecurity:    Worry: Not on file    Inability: Not on file  . Transportation needs:    Medical: Not on file    Non-medical: Not on file  Tobacco Use  . Smoking status: Former Smoker    Packs/day: 0.25    Years: 5.00    Pack years: 1.25    Types: Cigarettes    Last attempt to quit: 11/21/2013    Years since quitting: 3.8  . Smokeless tobacco: Never Used  Substance and Sexual Activity  . Alcohol use: Yes    Comment: 2-3 beers week  . Drug use: No  . Sexual activity: Yes    Birth control/protection: None  Lifestyle  . Physical activity:    Days per week: Not on file    Minutes per session: Not on file  . Stress: Not on file  Relationships  . Social connections:    Talks on phone: Not on file    Gets together: Not on file    Attends religious service: Not on file    Active member of club or organization: Not on file    Attends meetings of clubs or organizations: Not on file    Relationship status: Not on file  . Intimate partner violence:    Fear of current or ex partner: Not on file  Emotionally abused: Not on file    Physically abused: Not on file    Forced sexual activity: Not on file  Other Topics Concern  . Not on file  Social History Narrative  . Not on file      Review of Systems  All other systems reviewed and are negative.      Objective:   Physical Exam  Constitutional: He appears well-developed.  Cardiovascular: Normal rate, regular rhythm and normal heart sounds.  Pulmonary/Chest: Effort normal and breath sounds normal.  Vitals reviewed.         Assessment & Plan:  Weight loss counseling, encounter for  Spent 20 minutes discussing with the patient weight loss strategies.  Recommended the Mediterranean diet.  Recommended 1500 cal/day.  Recommended 30 minutes a day of aerobic exercise.  Will use Adipex 37.5 mg p.o. every morning every day for the next 3 months to help suppress his appetite while instituting the lifestyle changes.   Discussed the risk with the patient including cardiovascular risk.  He will monitor his blood pressure every day and he if develops any chest pain, shortness of breath, palpitations, or increased blood pressure he will discontinue the medication immediately and let me know.

## 2019-01-21 DIAGNOSIS — S59912A Unspecified injury of left forearm, initial encounter: Secondary | ICD-10-CM | POA: Insufficient documentation

## 2019-04-22 IMAGING — CR DG ELBOW COMPLETE 3+V*R*
4 series · 4 of 4 positions shown · non-contrast
Comparison: None in PACs

CLINICAL DATA: Several month history of right elbow soreness and
pain association with motion. Decreased grip strength.

EXAM:
RIGHT ELBOW - COMPLETE 3+ VIEW

[x elbow ap right]
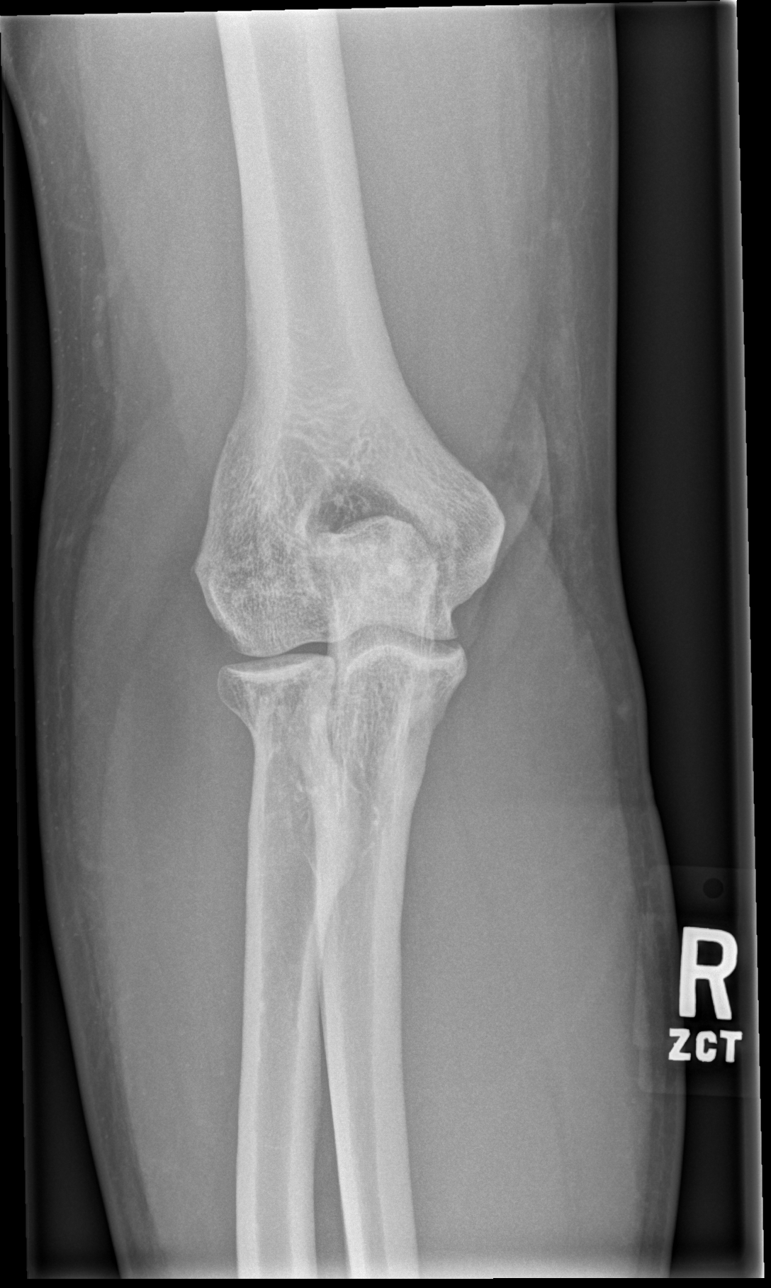

[x elbow obl right (1 of 2)]
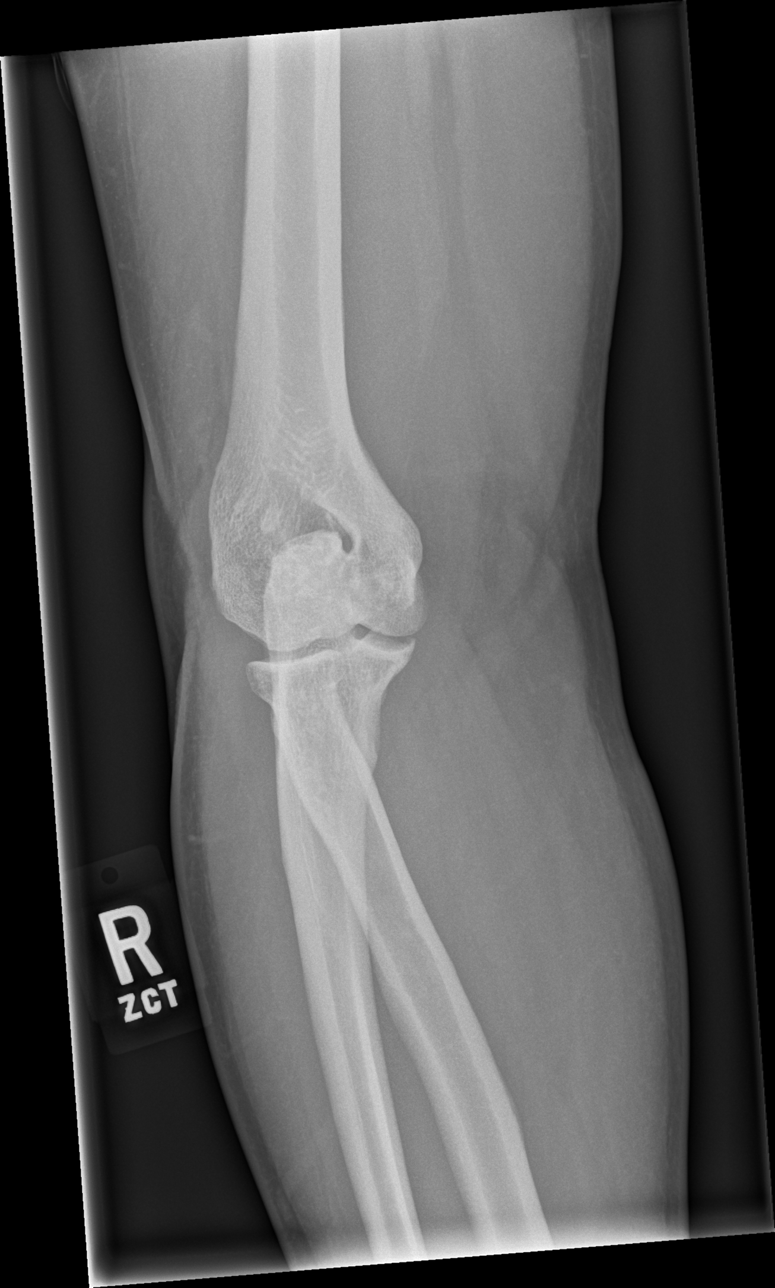

[x elbow obl right (2 of 2)]
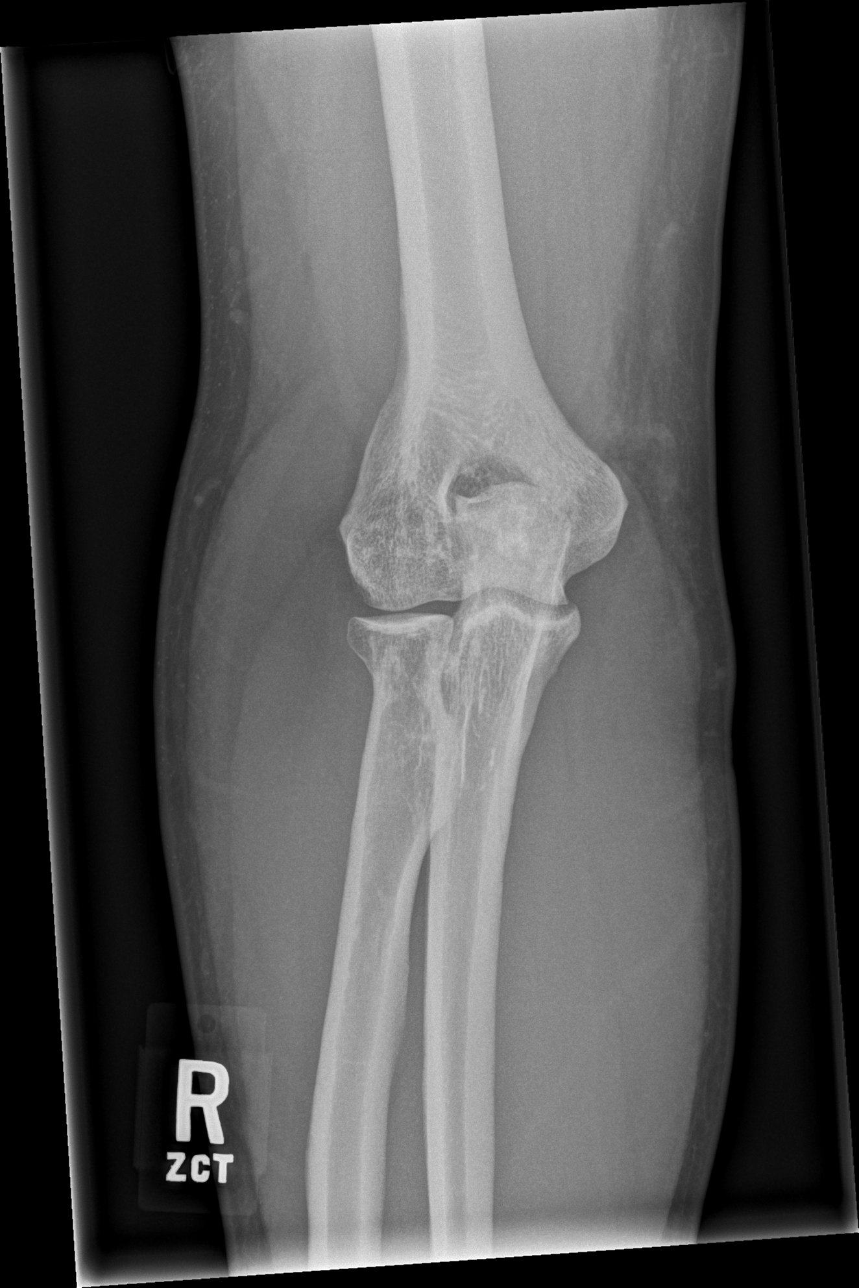

[x elbow lat right]
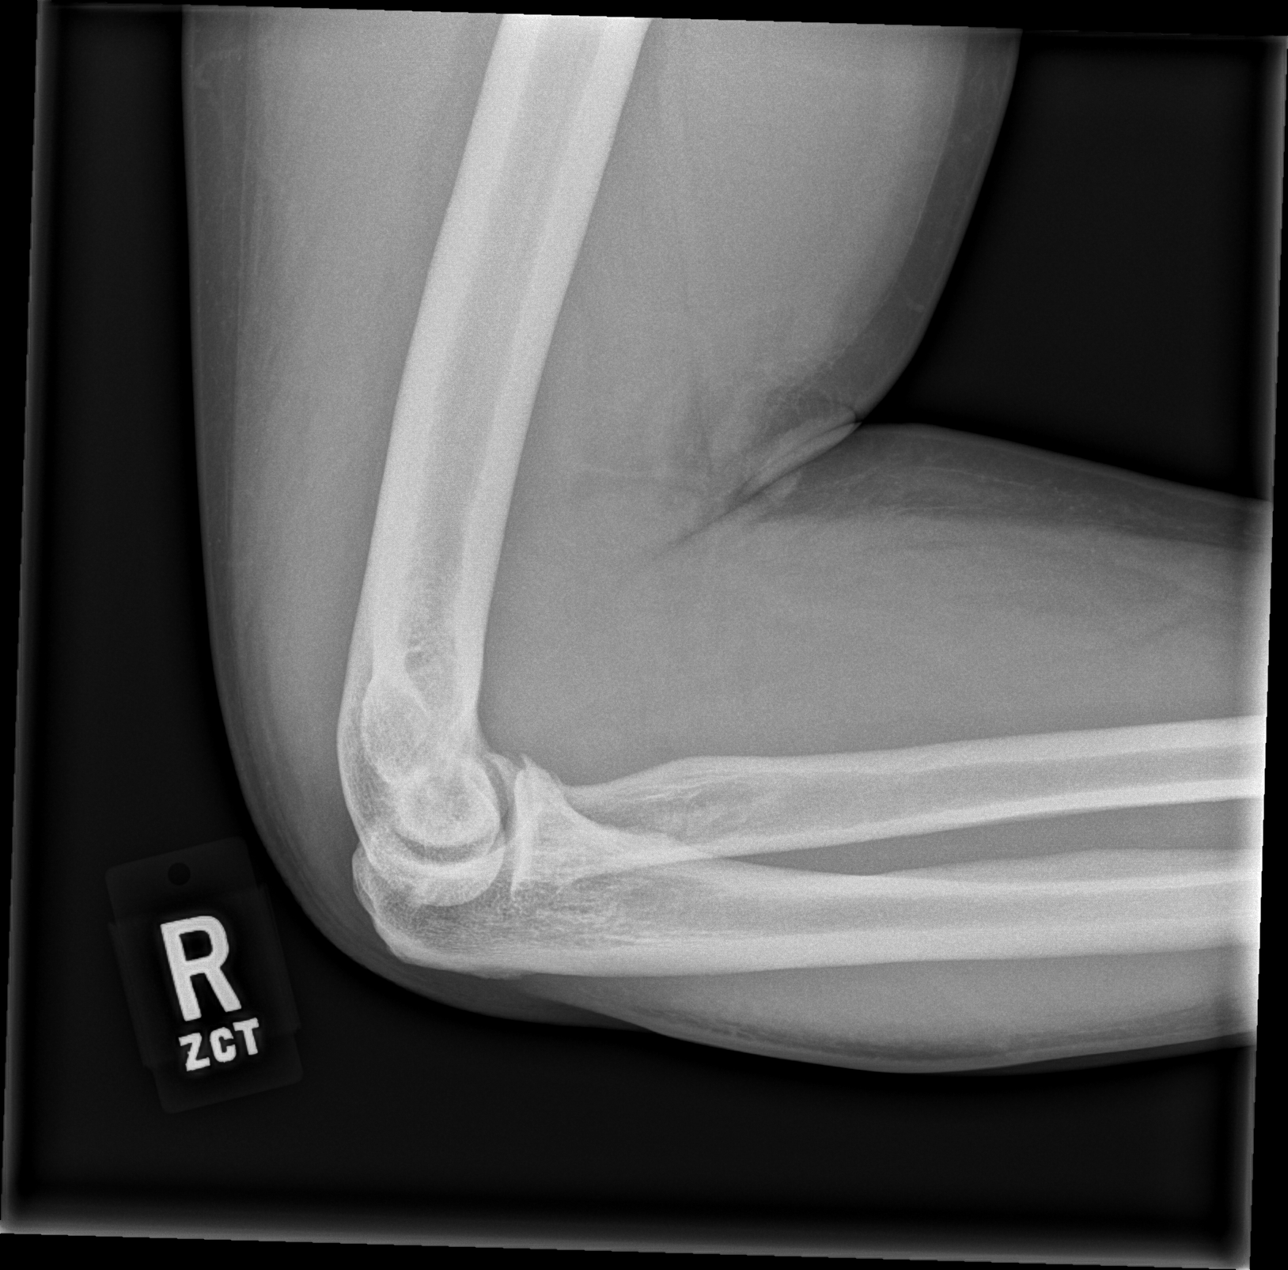

[4 of 4 positions shown; findings below may reference images not displayed]

FINDINGS: The bones are subjectively adequately mineralized. The radial head
is intact. The olecranon is also intact. The condylar and
supracondylar regions of the distal humerus are normal. There is no
joint effusion.
IMPRESSION: There is no acute or significant chronic bony abnormality of the
elbow.

## 2019-09-09 ENCOUNTER — Other Ambulatory Visit (HOSPITAL_COMMUNITY): Payer: Self-pay | Admitting: Psychiatry

## 2019-09-10 ENCOUNTER — Other Ambulatory Visit (HOSPITAL_COMMUNITY): Payer: Self-pay | Admitting: Psychiatry

## 2019-09-10 ENCOUNTER — Other Ambulatory Visit: Payer: Self-pay | Admitting: Family Medicine

## 2019-09-10 NOTE — Telephone Encounter (Signed)
Please call pharmacy and tell them they are sending this to the wrong MD, 2nd request

## 2019-09-10 NOTE — Telephone Encounter (Signed)
PER PROVIDER : Please call pharmacy and tell them they are sending this to the wrong MD, 2nd request  SPOKE WITH [place] @ [facility]  & INFORMED

## 2019-09-10 NOTE — Telephone Encounter (Signed)
Requesting refill    Phentermine  LOV: 09/25/2017  LRF:  09/25/17

## 2022-01-08 ENCOUNTER — Ambulatory Visit (INDEPENDENT_AMBULATORY_CARE_PROVIDER_SITE_OTHER): Payer: BC Managed Care – PPO | Admitting: Family Medicine

## 2022-01-08 VITALS — BP 128/82 | HR 55 | Temp 97.6°F | Ht 66.0 in | Wt 248.0 lb

## 2022-01-08 DIAGNOSIS — Z0000 Encounter for general adult medical examination without abnormal findings: Secondary | ICD-10-CM

## 2022-01-08 LAB — CBC WITH DIFFERENTIAL/PLATELET
Absolute Monocytes: 448 cells/uL (ref 200–950)
Basophils Absolute: 98 cells/uL (ref 0–200)
Eosinophils Absolute: 322 cells/uL (ref 15–500)
Eosinophils Relative: 4.6 %
HCT: 45.9 % (ref 38.5–50.0)
Hemoglobin: 16.2 g/dL (ref 13.2–17.1)
MCHC: 35.3 g/dL (ref 32.0–36.0)
Neutrophils Relative %: 61 %
RBC: 4.88 10*6/uL (ref 4.20–5.80)
RDW: 12.5 % (ref 11.0–15.0)
WBC: 7 10*3/uL (ref 3.8–10.8)

## 2022-01-08 MED ORDER — WEGOVY 0.5 MG/0.5ML ~~LOC~~ SOAJ: 0.5000 mg | SUBCUTANEOUS | 0 refills | Status: DC

## 2022-01-08 NOTE — Progress Notes (Signed)
Subjective:    Patient ID: Willie Osborne, male    DOB: 1980-11-06, 41 y.o.   MRN: [id number]  HPI Patient is here today for complete physical exam.  He continues to have problems losing weight.  His BMI is 40.  His blood pressure is good at 128/82.  In the past he has tried Adipex.  He is also try therapeutic lifestyle changes.  Previously he was able to reduce his weight to 200 pounds however this started creeping back up and is gone up to 248 today.  He denies any chest pain shortness of breath or dyspnea on exertion.  He is interested in [place].  He is not yet due for colonoscopy or prostate cancer screening.  He does have a history of mild obstructive sleep apnea seen on a home sleep study in 2019 but he is not being treated for it.  He does report feeling extremely tired every morning when he wakes up.  He states that he is not sleeping well.  He just does not feel rested despite getting a normal night sleep. Past Medical History:  Diagnosis Date   Hyperlipidemia    Past Surgical History:  Procedure Laterality Date   CARPAL TUNNEL RELEASE Right    EAR CYST EXCISION N/A 02/25/2014   Procedure: EXCISION CYST, SCROTUM;  Surgeon: Jamesetta So, MD;  Location: [facility];  Service: General;  Laterality: N/A;   Current Outpatient Medications on File Prior to Visit  Medication Sig Dispense Refill   phentermine (ADIPEX-P) 37.5 MG tablet Take 1 tablet (37.5 mg total) by mouth daily before breakfast. (Patient not taking: Reported on 01/08/2022) 30 tablet 2   No current facility-administered medications on file prior to visit.   Allergies  Allergen Reactions   Lipitor [Atorvastatin Calcium] Other (See Comments)    neuralgia   Elemental Sulfur    Sulfamethoxazole Rash   Social History   Socioeconomic History   Marital status: Single    Spouse name: bryanna Vega   Number of children: Not on file   Years of education: Not on file   Highest education level: Not on file  Occupational  History   Occupation: farm Horticulturist, commercial    Comment: insurance agent  Tobacco Use   Smoking status: Former    Packs/day: 0.25    Years: 5.00    Total pack years: 1.25    Types: Cigarettes    Quit date: 11/21/2013    Years since quitting: 8.1   Smokeless tobacco: Never  Substance and Sexual Activity   Alcohol use: Yes    Comment: 2-3 beers week   Drug use: No   Sexual activity: Yes    Birth control/protection: None  Other Topics Concern   Not on file  Social History Narrative   Not on file   Social Determinants of Health   Financial Resource Strain: Not on file  Food Insecurity: Not on file  Transportation Needs: Not on file  Physical Activity: Not on file  Stress: Not on file  Social Connections: Not on file  Intimate Partner Violence: Not on file      Review of Systems  All other systems reviewed and are negative.      Objective:   Physical Exam Vitals reviewed.  Constitutional:      General: He is not in acute distress.    Appearance: Normal appearance. He is obese. He is not ill-appearing or toxic-appearing.  HENT:     Head: Normocephalic and atraumatic.  Right Ear: Tympanic membrane and ear canal normal.     Left Ear: Tympanic membrane normal.     Nose: Nose normal. No congestion or rhinorrhea.     Mouth/Throat:     Mouth: Mucous membranes are moist.     Pharynx: Oropharynx is clear. No oropharyngeal exudate or posterior oropharyngeal erythema.  Eyes:     Extraocular Movements: Extraocular movements intact.     Conjunctiva/sclera: Conjunctivae normal.     Pupils: Pupils are equal, round, and reactive to light.  Neck:     Vascular: No carotid bruit.  Cardiovascular:     Rate and Rhythm: Normal rate and regular rhythm.     Pulses: Normal pulses.     Heart sounds: Normal heart sounds. No murmur heard.    No friction rub. No gallop.  Pulmonary:     Effort: Pulmonary effort is normal. No respiratory distress.     Breath sounds: Normal breath sounds. No  stridor. No wheezing, rhonchi or rales.  Chest:     Chest wall: No tenderness.  Abdominal:     General: Abdomen is flat. Bowel sounds are normal. There is no distension.     Palpations: Abdomen is soft.     Tenderness: There is no abdominal tenderness.  Musculoskeletal:        General: No swelling, tenderness, deformity or signs of injury.     Cervical back: Normal range of motion. No rigidity.     Right lower leg: No edema.     Left lower leg: No edema.  Lymphadenopathy:     Cervical: No cervical adenopathy.  Skin:    Findings: No bruising, lesion or rash.  Neurological:     General: No focal deficit present.     Mental Status: He is alert and oriented to person, place, and time. Mental status is at baseline.     Cranial Nerves: No cranial nerve deficit.     Motor: No weakness.     Gait: Gait normal.  Psychiatric:        Mood and Affect: Mood normal.        Behavior: Behavior normal.        Thought Content: Thought content normal.        Judgment: Judgment normal.           Assessment & Plan:   General medical exam - Plan: CBC with Differential/Platelet, COMPLETE METABOLIC PANEL WITH GFR, Lipid panel Physical exam today is completely normal except for his elevated BMI.  Patient would like to try Wegovy 0.5 mg subcu weekly.  We discussed this medication at length and he will contact me in 1 month let me know if he wants to increase the dose.  I recommended a flu shot this fall.  His tetanus shot is up-to-date.  He is not due for any cancer screening.  I do believe he is dealing with obstructive sleep apnea.  Patient would like to think about it before I write a prescription for a CPAP auto titrating.  He will contact me if he decides he wants to try therapy for obstructive sleep apnea

## 2022-01-09 LAB — COMPLETE METABOLIC PANEL WITH GFR
AG Ratio: 1.5 (calc) (ref 1.0–2.5)
ALT: 48 U/L — ABNORMAL HIGH (ref 9–46)
AST: 35 U/L (ref 10–40)
Albumin: 4.3 g/dL (ref 3.6–5.1)
Alkaline phosphatase (APISO): 59 U/L (ref 36–130)
BUN: 11 mg/dL (ref 7–25)
CO2: 25 mmol/L (ref 20–32)
Calcium: 9 mg/dL (ref 8.6–10.3)
Chloride: 105 mmol/L (ref 98–110)
Creat: 0.99 mg/dL (ref 0.60–1.29)
Globulin: 2.8 g/dL (calc) (ref 1.9–3.7)
Glucose, Bld: 97 mg/dL (ref 65–99)
Potassium: 4.5 mmol/L (ref 3.5–5.3)
Sodium: 141 mmol/L (ref 135–146)
Total Bilirubin: 0.4 mg/dL (ref 0.2–1.2)
Total Protein: 7.1 g/dL (ref 6.1–8.1)
eGFR: 97 mL/min/{1.73_m2} (ref >=60)

## 2022-01-09 LAB — CBC WITH DIFFERENTIAL/PLATELET
Basophils Relative: 1.4 %
Lymphs Abs: 1862 cells/uL (ref 850–3900)
MCH: 33.2 pg — ABNORMAL HIGH (ref 27.0–33.0)
MCV: 94.1 fL (ref 80.0–100.0)
MPV: 14 fL — ABNORMAL HIGH (ref 7.5–12.5)
Monocytes Relative: 6.4 %
Neutro Abs: 4270 cells/uL (ref 1500–7800)
Platelets: 148 10*3/uL (ref 140–400)
Total Lymphocyte: 26.6 %

## 2022-01-09 LAB — LIPID PANEL
Cholesterol: 198 mg/dL (ref <200)
HDL: 39 mg/dL — ABNORMAL LOW (ref >=40)
LDL Cholesterol (Calc): 114 mg/dL (calc) — ABNORMAL HIGH
Non-HDL Cholesterol (Calc): 159 mg/dL (calc) — ABNORMAL HIGH (ref <130)
Total CHOL/HDL Ratio: 5.1 (calc) — ABNORMAL HIGH (ref <5.0)
Triglycerides: 339 mg/dL — ABNORMAL HIGH (ref <150)

## 2022-01-10 ENCOUNTER — Telehealth: Payer: Self-pay

## 2022-01-10 ENCOUNTER — Other Ambulatory Visit: Payer: Self-pay | Admitting: Family Medicine

## 2022-01-10 NOTE — Telephone Encounter (Signed)
PA-Wegovy send to plan

## 2022-01-11 ENCOUNTER — Other Ambulatory Visit: Payer: Self-pay

## 2022-01-11 DIAGNOSIS — E785 Hyperlipidemia, unspecified: Secondary | ICD-10-CM

## 2022-01-11 MED ORDER — ROSUVASTATIN CALCIUM 10 MG PO TABS: 10.0000 mg | ORAL_TABLET | Freq: Every day | ORAL | 3 refills | Status: DC

## 2022-01-16 ENCOUNTER — Telehealth: Payer: Self-pay

## 2022-01-16 NOTE — Telephone Encounter (Signed)
SECOND APPEAL SENT TO INSURANCE THROUGH COVER MY MEDS.

## 2022-01-21 ENCOUNTER — Other Ambulatory Visit: Payer: Self-pay | Admitting: Family Medicine

## 2022-01-21 MED ORDER — SAXENDA 18 MG/3ML ~~LOC~~ SOPN: 0.6000 mg | PEN_INJECTOR | Freq: Every day | SUBCUTANEOUS | 1 refills | Status: DC

## 2022-01-24 NOTE — Telephone Encounter (Signed)
Sent in [place] PA to plan

## 2022-01-28 NOTE — Telephone Encounter (Signed)
   prior authorization request Kirke Shaggy has been denied.

## 2022-04-11 ENCOUNTER — Other Ambulatory Visit: Payer: No Typology Code available for payment source

## 2022-05-17 ENCOUNTER — Encounter: Payer: Self-pay | Admitting: Family Medicine

## 2022-05-20 ENCOUNTER — Other Ambulatory Visit: Payer: Self-pay | Admitting: Family Medicine

## 2022-05-20 ENCOUNTER — Encounter: Payer: Self-pay | Admitting: Gastroenterology

## 2022-05-20 DIAGNOSIS — K649 Unspecified hemorrhoids: Secondary | ICD-10-CM

## 2022-06-12 ENCOUNTER — Ambulatory Visit (INDEPENDENT_AMBULATORY_CARE_PROVIDER_SITE_OTHER): Payer: BC Managed Care – PPO | Admitting: Gastroenterology

## 2022-06-12 ENCOUNTER — Encounter: Payer: Self-pay | Admitting: Gastroenterology

## 2022-06-12 VITALS — BP 130/70 | HR 95 | Ht 66.0 in | Wt 253.0 lb

## 2022-06-12 DIAGNOSIS — K648 Other hemorrhoids: Secondary | ICD-10-CM

## 2022-06-12 DIAGNOSIS — K921 Melena: Secondary | ICD-10-CM

## 2022-06-12 DIAGNOSIS — R194 Change in bowel habit: Secondary | ICD-10-CM

## 2022-06-12 MED ORDER — NA SULFATE-K SULFATE-MG SULF 17.5-3.13-1.6 GM/177ML PO SOLN: 1.0000 | Freq: Once | ORAL | 0 refills | Status: AC

## 2022-06-12 NOTE — Patient Instructions (Addendum)
  You have been scheduled for a colonoscopy. Please follow written instructions given to you at your visit today.  Please pick up your prep supplies at the pharmacy within the next 1-3 days. If you use inhalers (even only as needed), please bring them with you on the day of your procedure.   _______________________________________________________  If your blood pressure at your visit was 140/90 or greater, please contact your primary care physician to follow up on this.  _______________________________________________________  If you are age 6 or younger, your body mass index should be between 19-25. Your Body mass index is 40.84 kg/m. If this is out of the aformentioned range listed, please consider follow up with your Primary Care Provider.   __________________________________________________________  The [facility] providers would like to encourage you to use [facility] to communicate with providers for non-urgent requests or questions.  Due to long hold times on the telephone, sending your provider a message by [facility] may be a faster and more efficient way to get a response.  Please allow 48 business hours for a response.  Please remember that this is for non-urgent requests.   Due to recent changes in healthcare laws, you may see the results of your imaging and laboratory studies on MyChart before your provider has had a chance to review them.  We understand that in some cases there may be results that are confusing or concerning to you. Not all laboratory results come back in the same time frame and the provider may be waiting for multiple results in order to interpret others.  Please give [place] 48 hours in order for your provider to thoroughly review all the results before contacting the office for clarification of your results.     Thank you for choosing me and [facility].  Vito Cirigliano, D.O.

## 2022-06-12 NOTE — Progress Notes (Signed)
Chief Complaint: Hematochezia   Referring Provider:     Susy Frizzle, MD   HPI:     Willie Osborne is a 42 y.o. male with a history of HTN, HLD, referred to the [facility] for evaluation of hematochezia.  Has had hemorrhoidal sxs for years, described as intermittent BRB on tissue paper.  However, rectal symptoms started worsening about 6 months ago.  Now he describes having 3-4 days of lower back pain, occasional fever, dull rectal pain.  Will then have BRBPR lasting 24 hours or so with subsequent improvement in symptoms.  Typically will have BRBPR without BM, but has recently had episodes of mucus-like discharge.  Otherwise no constipation or diarrhea.  Has also been having episodes of bleeding independent of BM with BRB leaking.  No d/c/n/v.   Has tried using Preparation H suppositories w/o change, then hydrocortisone 2.5% topical w/ improvement.   No prior colonoscopy.   Mother with UC. No known family history of CRC, GI malignancy, liver disease, pancreatic disease.     Past Medical History:  Diagnosis Date   Hyperlipidemia    Hypertension    Obesity      Past Surgical History:  Procedure Laterality Date   CARPAL TUNNEL RELEASE Right    EAR CYST EXCISION N/A 02/25/2014   Procedure: EXCISION CYST, SCROTUM;  Surgeon: Jamesetta So, MD;  Location: [facility];  Service: General;  Laterality: N/A;   Family History  Problem Relation Age of Onset   Ulcerative colitis Mother    Colon cancer Neg Hx    Esophageal cancer Neg Hx    Rectal cancer Neg Hx    Social History   Tobacco Use   Smoking status: Former    Packs/day: 0.25    Years: 5.00    Total pack years: 1.25    Types: Cigarettes    Quit date: 11/21/2013    Years since quitting: 8.5   Smokeless tobacco: Never  Vaping Use   Vaping Use: Former  Substance Use Topics   Alcohol use: Yes    Comment: 2-3 beers week   Drug use: No   Current Outpatient Medications  Medication  Sig Dispense Refill   rosuvastatin (CRESTOR) 10 MG tablet Take 1 tablet (10 mg total) by mouth daily. 90 tablet 3   No current facility-administered medications for this visit.   Allergies  Allergen Reactions   Lipitor [Atorvastatin Calcium] Other (See Comments)    neuralgia   Elemental Sulfur    Sulfamethoxazole Rash     Review of Systems: All systems reviewed and negative except where noted in HPI.     Physical Exam:    Wt Readings from Last 3 Encounters:  06/12/22 253 lb (114.8 kg)  01/08/22 248 lb (112.5 kg)  09/25/17 254 lb (115.2 kg)    BP 130/70   Pulse 95   Ht '5\' 6"'$  (1.676 m)   Wt 253 lb (114.8 kg)   BMI 40.84 kg/m  Constitutional:  Pleasant, in no acute distress. Psychiatric: Normal mood and affect. Behavior is normal. Cardiovascular: Normal rate, regular rhythm. No edema Pulmonary/chest: Effort normal and breath sounds normal. No wheezing, rales or rhonchi. Abdominal: Soft, nondistended, nontender. Bowel sounds active throughout. There are no masses palpable. No hepatomegaly. Neurological: Alert and oriented to person place and time. Skin: Skin is warm and dry. No rashes noted. Rectal exam: Sensation intact and preserved anal wink.  Very small external  skin tag, otherwise no external anal fissures, external hemorrhoids.  No fistula or area of induration.  No TTP. Normal sphincter tone. No palpable mass. No blood on the exam glove.  Anoscopy with small internal hemorrhoids.  (Chaperone: Renee Rival, CMA).   ASSESSMENT AND PLAN;   1) Hematochezia 2) Change in bowel habits Progressive hematochezia and now with episodes of mucus-like stools.  No fistula or abscess noted on exam today.  Discussed full DDx with patient today with plan for the following: - Colonoscopy to evaluate for medical/luminal pathology with random and directed biopsies to evaluate for IBD  3) Internal hemorrhoids Exam today did demonstrate small internal hemorrhoids.  If colonoscopy  otherwise only notable for hemorrhoids, discussed setting up for hemorrhoid band ligation  4) Family history of IBD Mother with Ulcerative Colitis.  As above, plan for colonoscopy to rule out IBD as underlying etiology  The indications, risks, and benefits of colonoscopy were explained to the patient in detail. Risks include but are not limited to bleeding, perforation, adverse reaction to medications, and cardiopulmonary compromise. Sequelae include but are not limited to the possibility of surgery, hospitalization, and mortality. The patient verbalized understanding and wished to proceed. All questions answered, referred to the scheduler and bowel prep ordered. Further recommendations pending results of the exam.    Lavena Bullion, DO, FACG  06/12/2022, 9:38 AM   Dennard Schaumann, Cammie Mcgee, MD

## 2022-06-27 ENCOUNTER — Encounter: Payer: Self-pay | Admitting: Gastroenterology

## 2022-06-27 ENCOUNTER — Ambulatory Visit (AMBULATORY_SURGERY_CENTER): Payer: BC Managed Care – PPO | Admitting: Gastroenterology

## 2022-06-27 VITALS — BP 128/88 | HR 68 | Temp 98.2°F | Resp 17 | Ht 66.0 in | Wt 253.0 lb

## 2022-06-27 DIAGNOSIS — D125 Benign neoplasm of sigmoid colon: Secondary | ICD-10-CM

## 2022-06-27 DIAGNOSIS — D124 Benign neoplasm of descending colon: Secondary | ICD-10-CM

## 2022-06-27 DIAGNOSIS — R194 Change in bowel habit: Secondary | ICD-10-CM

## 2022-06-27 DIAGNOSIS — K641 Second degree hemorrhoids: Secondary | ICD-10-CM

## 2022-06-27 DIAGNOSIS — D120 Benign neoplasm of cecum: Secondary | ICD-10-CM

## 2022-06-27 DIAGNOSIS — D123 Benign neoplasm of transverse colon: Secondary | ICD-10-CM

## 2022-06-27 DIAGNOSIS — K635 Polyp of colon: Secondary | ICD-10-CM

## 2022-06-27 DIAGNOSIS — K921 Melena: Secondary | ICD-10-CM

## 2022-06-27 MED ORDER — SODIUM CHLORIDE 0.9 % IV SOLN: 500.0000 mL | Freq: Once | INTRAVENOUS | Status: DC

## 2022-06-27 NOTE — Progress Notes (Signed)
GASTROENTEROLOGY PROCEDURE H&P NOTE   Primary Care Physician: Susy Frizzle, MD    Reason for Procedure:   Hematochezia, mucus like stools, Fjx of UC  Plan:    Colonoscopy  Patient is appropriate for endoscopic procedure(s) in the ambulatory ([facility]) setting.  The nature of the procedure, as well as the risks, benefits, and alternatives were carefully and thoroughly reviewed with the patient. Ample time for discussion and questions allowed. The patient understood, was satisfied, and agreed to proceed.     HPI: Willie Osborne is a 42 y.o. male who presents for Colonoscopy for evaluation of hematochezia, mucus like stools .  Patient was most recently seen in the Gastroenterology Clinic on 06/12/2022 by me.  No interval change in medical history since that appointment. Please refer to that note for full details regarding GI history and clinical presentation.   Past Medical History:  Diagnosis Date   Hyperlipidemia    Hypertension    Obesity    Sleep apnea 2016   "diagnosed with mild sleep apnea, no CPAP recommended    Past Surgical History:  Procedure Laterality Date   CARPAL TUNNEL RELEASE Right    COLONOSCOPY     EAR CYST EXCISION N/A 02/25/2014   Procedure: EXCISION CYST, SCROTUM;  Surgeon: Jamesetta So, MD;  Location: [facility];  Service: General;  Laterality: N/A;    Prior to Admission medications   Medication Sig Start Date End Date Taking? Authorizing Provider  rosuvastatin (CRESTOR) 10 MG tablet Take 1 tablet (10 mg total) by mouth daily. 01/11/22  Yes Susy Frizzle, MD  doxycycline (VIBRA-TABS) 100 MG tablet Take 100 mg by mouth 2 (two) times daily. 05/20/22   [provider]  ketoconazole (NIZORAL) 2 % cream Apply 1 Application topically 2 (two) times daily. 05/20/22   [provider]    Current Outpatient Medications  Medication Sig Dispense Refill   rosuvastatin (CRESTOR) 10 MG tablet Take 1 tablet (10 mg total) by mouth daily. 90  tablet 3   doxycycline (VIBRA-TABS) 100 MG tablet Take 100 mg by mouth 2 (two) times daily.     ketoconazole (NIZORAL) 2 % cream Apply 1 Application topically 2 (two) times daily.     Current Facility-Administered Medications  Medication Dose Route Frequency Provider Last Rate Last Admin   0.9 %  sodium chloride infusion  500 mL Intravenous Once Razi Hickle V, DO        Allergies as of 06/27/2022 - Review Complete 06/27/2022  Allergen Reaction Noted   Lipitor [atorvastatin calcium] Other (See Comments) 08/07/2010   Elemental sulfur  08/07/2010   Sulfamethoxazole Rash 01/10/2019    Family History  Problem Relation Age of Onset   Ulcerative colitis Mother    Colon cancer Neg Hx    Esophageal cancer Neg Hx    Rectal cancer Neg Hx    Stomach cancer Neg Hx     Social History   Socioeconomic History   Marital status: Single    Spouse name: Not on file   Number of children: 0   Years of education: Not on file   Highest education level: Not on file  Occupational History   Occupation: outside Press photographer  Tobacco Use   Smoking status: Former    Packs/day: 0.25    Years: 5.00    Total pack years: 1.25    Types: Cigarettes    Quit date: 11/21/2013    Years since quitting: 8.6   Smokeless tobacco: Never  Vaping Use  Vaping Use: Former  Substance and Sexual Activity   Alcohol use: Yes    Comment: 2-3 beers week   Drug use: No   Sexual activity: Yes    Birth control/protection: None  Other Topics Concern   Not on file  Social History Narrative   Not on file   Social Determinants of Health   Financial Resource Strain: Not on file  Food Insecurity: Not on file  Transportation Needs: Not on file  Physical Activity: Not on file  Stress: Not on file  Social Connections: Not on file  Intimate Partner Violence: Not on file    Physical Exam: Vital signs in last 24 hours: @BP$  (!) 131/91   Pulse 75   Temp 98.2 F (36.8 C) (Temporal)   Resp 17   Ht 5' 6"$  (1.676 m)    Wt 253 lb (114.8 kg)   SpO2 99%   BMI 40.84 kg/m  GEN: NAD EYE: Sclerae anicteric ENT: MMM CV: Non-tachycardic Pulm: CTA b/l GI: Soft, NT/ND NEURO:  Alert & Oriented x 3   Gerrit Heck, DO [facility]   06/27/2022 4:05 PM

## 2022-06-27 NOTE — Op Note (Signed)
[facility] Patient Name: Willie Osborne Procedure Date: 06/27/2022 3:54 PM MRN: [id number] Endoscopist: Gerrit Heck , MD, [id number] Age: 42 Referring MD:  Date of Birth: 02-May-1981 Gender: Male Account #: [id number] Procedure:                Colonoscopy Indications:              Hematochezia, Change in bowel habits, Mucus like                            stools                           This is the patient's first colonoscopy.                           Family history notable for mother with Ulcerative                            Colitis. Medicines:                Monitored Anesthesia Care Procedure:                Pre-Anesthesia Assessment:                           - Prior to the procedure, a History and Physical                            was performed, and patient medications and                            allergies were reviewed. The patient's tolerance of                            previous anesthesia was also reviewed. The risks                            and benefits of the procedure and the sedation                            options and risks were discussed with the patient.                            All questions were answered, and informed consent                            was obtained. Prior Anticoagulants: The patient has                            taken no anticoagulant or antiplatelet agents. ASA                            Grade Assessment: III - A patient with severe  systemic disease. After reviewing the risks and                            benefits, the patient was deemed in satisfactory                            condition to undergo the procedure.                           After obtaining informed consent, the colonoscope                            was passed under direct vision. Throughout the                            procedure, the patient's blood pressure, pulse, and                            oxygen saturations  were monitored continuously. The                            CF [id number] was introduced through the anus                            and advanced to the 10 cm into the ileum. The                            colonoscopy was performed without difficulty. The                            patient tolerated the procedure well. The quality                            of the bowel preparation was good. The terminal                            ileum, ileocecal valve, appendiceal orifice, and                            rectum were photographed. Scope In: 4:13:13 PM Scope Out: 4:39:50 PM Scope Withdrawal Time: 0 hours 23 minutes 35 seconds  Total Procedure Duration: 0 hours 26 minutes 37 seconds  Findings:                 Hemorrhoids were found on perianal exam.                           Three sessile polyps were found in the descending                            colon, transverse colon, and cecum (with adherent                            mucus cap). The polyps were 4 to 6 mm in  size.                            These polyps were removed with a cold snare.                            Resection and retrieval were complete. Estimated                            blood loss was minimal.                           Two sessile polyps were found in the distal sigmoid                            colon. The polyps were 3 to 5 mm in size. These                            polyps were removed with a cold snare. Resection                            and retrieval were complete. Estimated blood loss                            was minimal.                           The mucosa was otherwise normal appearing                            throughout the colon. No areas of mucosal erythema,                            edema, erosions, or ulceration noted. Biopsies for                            histology were taken with a cold forceps from the                            right colon and left colon for evaluation of                             microscopic colitis. Estimated blood loss was                            minimal.                           Non-bleeding internal hemorrhoids were found during                            retroflexion. The hemorrhoids were small.                           The terminal ileum appeared normal.  Complications:            No immediate complications. Estimated Blood Loss:     Estimated blood loss was minimal. Impression:               - Hemorrhoids found on perianal exam.                           - Three 4 to 6 mm polyps in the descending colon,                            in the transverse colon and in the cecum, removed                            with a cold snare. Resected and retrieved.                           - Two 3 to 5 mm polyps in the distal sigmoid colon,                            removed with a cold snare. Resected and retrieved.                           - Normal mucosa in the entire examined colon.                            Biopsied.                           - Non-bleeding internal hemorrhoids.                           - The examined portion of the ileum was normal. Recommendation:           - Patient has a contact number available for                            emergencies. The signs and symptoms of potential                            delayed complications were discussed with the                            patient. Return to normal activities tomorrow.                            Written discharge instructions were provided to the                            patient.                           - Resume previous diet.                           - Continue present medications.                           -  Await pathology results.                           - Repeat colonoscopy for surveillance based on                            pathology results.                           - Return to GI office PRN.                           - Internal hemorrhoids were noted on this  study and                            may be amenable to hemorrhoid band ligation. If you                            are interested in further treatment of these                            hemorrhoids with band ligation, please contact my                            clinic to set up an appointment for evaluation and                            treatment. Gerrit Heck, MD 06/27/2022 4:46:20 PM

## 2022-06-27 NOTE — Progress Notes (Signed)
To pacu, VSS. Report to [email or web address] 

## 2022-06-27 NOTE — Progress Notes (Signed)
Called to room to assist during endoscopic procedure.  Patient ID and intended procedure confirmed with present staff. Received instructions for my participation in the procedure from the performing physician.  

## 2022-06-27 NOTE — Patient Instructions (Signed)
Handouts on polyps and hemorrhoids (hemorrhoidal banding) given to patient. Await pathology results Resume previous diet and continue present medications Repeat colonoscopy for surveillance will be determined based off of pathology results.   YOU HAD AN ENDOSCOPIC PROCEDURE TODAY AT [facility]:   Refer to the procedure report that was given to you for any specific questions about what was found during the examination.  If the procedure report does not answer your questions, please call your gastroenterologist to clarify.  If you requested that your care partner not be given the details of your procedure findings, then the procedure report has been included in a sealed envelope for you to review at your convenience later.  YOU SHOULD EXPECT: Some feelings of bloating in the abdomen. Passage of more gas than usual.  Walking can help get rid of the air that was put into your GI tract during the procedure and reduce the bloating. If you had a lower endoscopy (such as a colonoscopy or flexible sigmoidoscopy) you may notice spotting of blood in your stool or on the toilet paper. If you underwent a bowel prep for your procedure, you may not have a normal bowel movement for a few days.  Please Note:  You might notice some irritation and congestion in your nose or some drainage.  This is from the oxygen used during your procedure.  There is no need for concern and it should clear up in a day or so.  SYMPTOMS TO REPORT IMMEDIATELY:  Following lower endoscopy (colonoscopy or flexible sigmoidoscopy):  Excessive amounts of blood in the stool  Significant tenderness or worsening of abdominal pains  Swelling of the abdomen that is new, acute  Fever of 100F or higher   For urgent or emergent issues, a gastroenterologist can be reached at any hour by calling [phone]. Do not use MyChart messaging for urgent concerns.    DIET:  We do recommend a small meal at first, but then you may  proceed to your regular diet.  Drink plenty of fluids but you should avoid alcoholic beverages for 24 hours.  ACTIVITY:  You should plan to take it easy for the rest of today and you should NOT DRIVE or use heavy machinery until tomorrow (because of the sedation medicines used during the test).    FOLLOW UP: Our staff will call the number listed on your records the next business day following your procedure.  We will call around 7:15- 8:00 am to check on you and address any questions or concerns that you may have regarding the information given to you following your procedure. If we do not reach you, we will leave a message.     If any biopsies were taken you will be contacted by phone or by letter within the next 1-3 weeks.  Please call us at [phone] if you have not heard about the biopsies in 3 weeks.    SIGNATURES/CONFIDENTIALITY: You and/or your care partner have signed paperwork which will be entered into your electronic medical record.  These signatures attest to the fact that that the information above on your After Visit Summary has been reviewed and is understood.  Full responsibility of the confidentiality of this discharge information lies with you and/or your care-partner.

## 2022-06-27 NOTE — Progress Notes (Signed)
Pt's states no medical or surgical changes since previsit or office visit. 

## 2022-06-28 ENCOUNTER — Telehealth: Payer: Self-pay

## 2022-06-28 NOTE — Telephone Encounter (Signed)
  Follow up Call-     06/27/2022    3:19 PM  Call back number  Post procedure Call Back phone  # [phone]  Permission to leave phone message Yes     Patient questions:  Do you have a fever, pain , or abdominal swelling? No. Pain Score  0 *  Have you tolerated food without any problems? Yes.    Have you been able to return to your normal activities? Yes.    Do you have any questions about your discharge instructions: Diet   No. Medications  No. Follow up visit  No.  Do you have questions or concerns about your Care? No.  Actions: * If pain score is 4 or above: No action needed, pain <4.

## 2022-07-03 ENCOUNTER — Encounter: Payer: Self-pay | Admitting: Gastroenterology

## 2022-09-18 ENCOUNTER — Ambulatory Visit (INDEPENDENT_AMBULATORY_CARE_PROVIDER_SITE_OTHER): Payer: BC Managed Care – PPO | Admitting: Gastroenterology

## 2022-09-18 ENCOUNTER — Encounter: Payer: Self-pay | Admitting: Gastroenterology

## 2022-09-18 VITALS — BP 122/80 | HR 91 | Ht 66.0 in | Wt 256.0 lb

## 2022-09-18 DIAGNOSIS — Z86010 Personal history of colonic polyps: Secondary | ICD-10-CM | POA: Diagnosis not present

## 2022-09-18 DIAGNOSIS — K641 Second degree hemorrhoids: Secondary | ICD-10-CM

## 2022-09-18 NOTE — Progress Notes (Signed)
Chief Complaint:    Symptomatic Internal Hemorrhoids; Hemorrhoid Band Ligation  GI History: 42 y.o. male with a history of HTN, HLD, follows in the GI clinic for the following:  - 06/12/2022: Initial appointment in GI clinic for intermittent BRBPR on tissue paper for years, with worsening symptoms over the preceding 6 months.  No improvement with topical therapy. - 06/27/2022: Colonoscopy: 3 subcentimeter polyps (path: TA x 2, SSP x 1), 2 small 3-5 mm distal sigmoid hyperplastic polyps.  Biopsies negative for MC.  Small internal hemorrhoids.  Normal TI.  Repeat in 5 years.  Family history notable for mother with Ulcerative Colitis.  HPI:     Patient is a 42 y.o. malewith a history of symptomatic internal hemorrhoids presenting to the Gastroenterology Clinic for follow-up and ongoing treatment. The patient presents with symptomatic grade 2 hemorrhoids, unresponsive to maximal medical therapy, requesting rubber band ligation of symptomatic hemorrhoidal disease.  No change in medical or surgical history, medications, allergies, social history since last appointment with me.   Review of systems:     No chest pain, no SOB, no fevers, no urinary sx   Past Medical History:  Diagnosis Date   Hyperlipidemia    Hypertension    Obesity    Sleep apnea 2016   "diagnosed with mild sleep apnea, no CPAP recommended    Patient's surgical history, family medical history, social history, medications and allergies were all reviewed in Epic    Current Outpatient Medications  Medication Sig Dispense Refill   doxycycline (VIBRA-TABS) 100 MG tablet Take 100 mg by mouth 2 (two) times daily.     ketoconazole (NIZORAL) 2 % cream Apply 1 Application topically 2 (two) times daily.     rosuvastatin (CRESTOR) 10 MG tablet Take 1 tablet (10 mg total) by mouth daily. 90 tablet 3   No current facility-administered medications for this visit.    Physical Exam:     BP 122/80   Pulse 91   Ht 5\' 6"  (1.676  m)   Wt 256 lb (116.1 kg)   BMI 41.32 kg/m   GENERAL:  Pleasant male in NAD PSYCH: : Cooperative, normal affect NEURO: Alert and oriented x 3, no focal neurologic deficits Rectal exam: Sensation intact and preserved anal wink.  Grade 2 hemorrhoids noted in all positions on anoscopy.  No external anal fissures noted. Normal sphincter tone. No palpable mass. No blood on the exam glove. (Chaperone: Ailene Rud, CMA).   IMPRESSION and PLAN:    #1.  Symptomatic internal hemorrhoids: PROCEDURE NOTE: The patient presents with symptomatic grade 2 hemorrhoids, unresponsive to maximal medical therapy, requesting rubber band ligation of symptomatic hemorrhoidal disease.  All risks, benefits and alternative forms of therapy were described and informed consent was obtained.  In the Left Lateral Decubitus position, anoscopic examination revealed grade 2 hemorrhoids in the all position(s).  The anorectum was pre-medicated with RectiCare. The decision was made to band the LL internal hemorrhoid, and the [facility] O'Regan System was used to perform band ligation without complication.  Digital anorectal examination was then performed to assure proper positioning of the band, and to adjust the banded tissue as required.  The patient was discharged home without pain or other issues.  Dietary and behavioral recommendations were given and along with follow-up instructions.     The following adjunctive treatments were recommended:  -Resume high-fiber diet with fiber supplement (i.e. Citrucel or Benefiber) with goal for soft stools without straining to have a BM. -Resume adequate fluid intake.  The patient will return in 4 for follow-up and possible additional banding as required. No complications were encountered and the patient tolerated the procedure well.   #2.  History of colon polyps - Repeat colonoscopy 2029 for ongoing polyp surveillance      Shellia Cleverly ,DO, FACG 09/18/2022, 4:13 PM

## 2022-09-18 NOTE — Patient Instructions (Signed)
You have been scheduled for an appointment with Dr. Barron Alvine on 10/31/22 at 2:40 PM for 2nd Hemorrhoid banding . Please arrive 10 minutes early for your appointment.   HEMORRHOID BANDING PROCEDURE    FOLLOW-UP CARE   The procedure you have had should have been relatively painless since the banding of the area involved does not have nerve endings and there is no pain sensation.  The rubber band cuts off the blood supply to the hemorrhoid and the band may fall off as soon as 48 hours after the banding (the band may occasionally be seen in the toilet bowl following a bowel movement). You may notice a temporary feeling of fullness in the rectum which should respond adequately to plain Tylenol or Motrin.  Following the banding, avoid strenuous exercise that evening and resume full activity the next day.  A sitz bath (soaking in a warm tub) or bidet is soothing, and can be useful for cleansing the area after bowel movements.     To avoid constipation, take two tablespoons of natural wheat bran, natural oat bran, flax, Benefiber or any over the counter fiber supplement and increase your water intake to 7-8 glasses daily.    Unless you have been prescribed anorectal medication, do not put anything inside your rectum for two weeks: No suppositories, enemas, fingers, etc.  Occasionally, you may have more bleeding than usual after the banding procedure.  This is often from the untreated hemorrhoids rather than the treated one.  Don't be concerned if there is a tablespoon or so of blood.  If there is more blood than this, lie flat with your bottom higher than your head and apply an ice pack to the area. If the bleeding does not stop within a half an hour or if you feel faint, call our office at [phone] or go to the emergency room.  Problems are not common; however, if there is a substantial amount of bleeding, severe pain, chills, fever or difficulty passing urine (very rare) or other problems, you  should call us at [phone] or report to the nearest emergency room.  Do not stay seated continuously for more than 2-3 hours for a day or two after the procedure.  Tighten your buttock muscles 10-15 times every two hours and take 10-15 deep breaths every 1-2 hours.  Do not spend more than a few minutes on the toilet if you cannot empty your bowel; instead re-visit the toilet at a later time.    Thank you for choosing me and [facility].  Vito Cirigliano, D.O.

## 2022-10-31 ENCOUNTER — Encounter: Payer: Self-pay | Admitting: Gastroenterology

## 2022-10-31 ENCOUNTER — Ambulatory Visit (INDEPENDENT_AMBULATORY_CARE_PROVIDER_SITE_OTHER): Payer: BC Managed Care – PPO | Admitting: Gastroenterology

## 2022-10-31 VITALS — BP 120/74 | HR 92 | Ht 66.0 in | Wt 257.5 lb

## 2022-10-31 DIAGNOSIS — K641 Second degree hemorrhoids: Secondary | ICD-10-CM | POA: Diagnosis not present

## 2022-10-31 NOTE — Patient Instructions (Addendum)
You have been scheduled for an appointment with Dr. Barron Alvine on 12/31/22 at 3:40 AM . Please arrive 10 minutes early for your appointment.   HEMORRHOID BANDING PROCEDURE    FOLLOW-UP CARE   The procedure you have had should have been relatively painless since the banding of the area involved does not have nerve endings and there is no pain sensation.  The rubber band cuts off the blood supply to the hemorrhoid and the band may fall off as soon as 48 hours after the banding (the band may occasionally be seen in the toilet bowl following a bowel movement). You may notice a temporary feeling of fullness in the rectum which should respond adequately to plain Tylenol or Motrin.  Following the banding, avoid strenuous exercise that evening and resume full activity the next day.  A sitz bath (soaking in a warm tub) or bidet is soothing, and can be useful for cleansing the area after bowel movements.     To avoid constipation, take two tablespoons of natural wheat bran, natural oat bran, flax, Benefiber or any over the counter fiber supplement and increase your water intake to 7-8 glasses daily.    Unless you have been prescribed anorectal medication, do not put anything inside your rectum for two weeks: No suppositories, enemas, fingers, etc.  Occasionally, you may have more bleeding than usual after the banding procedure.  This is often from the untreated hemorrhoids rather than the treated one.  Don't be concerned if there is a tablespoon or so of blood.  If there is more blood than this, lie flat with your bottom higher than your head and apply an ice pack to the area. If the bleeding does not stop within a half an hour or if you feel faint, call our office at [phone] or go to the emergency room.  Problems are not common; however, if there is a substantial amount of bleeding, severe pain, chills, fever or difficulty passing urine (very rare) or other problems, you should call us at [phone]  [phone] or report to the nearest emergency room.  Do not stay seated continuously for more than 2-3 hours for a day or two after the procedure.  Tighten your buttock muscles 10-15 times every two hours and take 10-15 deep breaths every 1-2 hours.  Do not spend more than a few minutes on the toilet if you cannot empty your bowel; instead re-visit the toilet at a later time.     Thank you for choosing me and [facility].  Vito Cirigliano, D.O.

## 2022-10-31 NOTE — Progress Notes (Signed)
Chief Complaint:    Symptomatic Internal Hemorrhoids; Hemorrhoid Band Ligation  GI History: 42 y.o. male with a history of HTN, HLD, follows in the GI clinic for the following:   - 06/12/2022: Initial appointment in GI clinic for intermittent BRBPR on tissue paper for years, with worsening symptoms over the preceding 6 months.  No improvement with topical therapy. - 06/27/2022: Colonoscopy: 3 subcentimeter polyps (path: TA x 2, SSP x 1), 2 small 3-5 mm distal sigmoid hyperplastic polyps.  Biopsies negative for MC.  Small internal hemorrhoids.  Normal TI.  Repeat in 5 years. - 09/18/2022: Banding of LL hemorrhoid   Family history notable for mother with Ulcerative Colitis.  HPI:     Patient is a 42 y.o. malewith a history of symptomatic internal hemorrhoids presenting to the Gastroenterology Clinic for follow-up and ongoing treatment. The patient presents with symptomatic grade 2 hemorrhoids, unresponsive to maximal medical therapy, requesting rubber band ligation of symptomatic hemorrhoidal disease.  Has done well after the first hemorrhoid banding.  Did have symptoms of what sounded like a abscess of his left buttock a few weeks after hemorrhoid banding.  This was located far away from his anal canal and resolved after spontaneous rupture, drainage, and clearance.  No recurrence.  No change in medical or surgical history, medications, allergies, social history since last appointment with me.   Review of systems:     No chest pain, no SOB, no fevers, no urinary sx   Past Medical History:  Diagnosis Date   Hyperlipidemia    Hypertension    Obesity    Sleep apnea 2016   "diagnosed with mild sleep apnea, no CPAP recommended    Patient's surgical history, family medical history, social history, medications and allergies were all reviewed in Epic    Current Outpatient Medications  Medication Sig Dispense Refill   doxycycline (VIBRA-TABS) 100 MG tablet Take 100 mg by mouth as needed.      ketoconazole (NIZORAL) 2 % cream Apply 1 Application topically 2 (two) times daily.     rosuvastatin (CRESTOR) 10 MG tablet Take 1 tablet (10 mg total) by mouth daily. 90 tablet 3   No current facility-administered medications for this visit.    Physical Exam:     BP 120/74   Pulse 92   Ht 5\' 6"  (1.676 m)   Wt 257 lb 8 oz (116.8 kg)   BMI 41.56 kg/m   GENERAL:  Pleasant male in NAD PSYCH: : Cooperative, normal affect NEURO: Alert and oriented x 3, no focal neurologic deficits Rectal exam: Sensation intact and preserved anal wink.  Grade 2 hemorrhoids noted in RA and RP positions on exam.  No external anal fissures noted. Normal sphincter tone. No palpable mass. No blood on the exam glove. (Chaperone: Ailene Rud, CMA).   IMPRESSION and PLAN:    #1.  Symptomatic internal hemorrhoids: PROCEDURE NOTE: The patient presents with symptomatic grade 2 hemorrhoids, unresponsive to maximal medical therapy, requesting rubber band ligation of symptomatic hemorrhoidal disease.  All risks, benefits and alternative forms of therapy were described and informed consent was obtained.  In the Left Lateral Decubitus position, anoscopic examination revealed grade 2 hemorrhoids in the RA and RP position(s).  The anorectum was pre-medicated with RectiCare. The decision was made to band the RP internal hemorrhoid, and the [facility] O'Regan System was used to perform band ligation without complication.  Digital anorectal examination was then performed to assure proper positioning of the band, and to adjust the banded  tissue as required.  The patient was discharged home without pain or other issues.  Dietary and behavioral recommendations were given and along with follow-up instructions.     The following adjunctive treatments were recommended:  -Resume high-fiber diet with fiber supplement (i.e. Citrucel or Benefiber) with goal for soft stools without straining to have a BM. -Resume adequate fluid  intake.  The patient will return in 4 for follow-up and possible additional banding as required. No complications were encountered and the patient tolerated the procedure well.   #2.  History of colon polyps - Repeat colonoscopy 2029 for ongoing polyp surveillance      Verlin Dike Magenta Schmiesing ,DO, FACG 10/31/2022, 3:00 PM

## 2022-11-11 ENCOUNTER — Telehealth: Payer: Self-pay | Admitting: Gastroenterology

## 2022-11-11 DIAGNOSIS — K921 Melena: Secondary | ICD-10-CM

## 2022-11-11 DIAGNOSIS — K641 Second degree hemorrhoids: Secondary | ICD-10-CM

## 2022-11-11 NOTE — Telephone Encounter (Signed)
Inbound call from patient stating he has the sensation of having to use the bathroom but has been purely blood that comes out. Requesting a call back. Please advise, thank you.

## 2022-11-12 ENCOUNTER — Encounter: Payer: Self-pay | Admitting: Gastroenterology

## 2022-11-12 ENCOUNTER — Other Ambulatory Visit (INDEPENDENT_AMBULATORY_CARE_PROVIDER_SITE_OTHER): Payer: BC Managed Care – PPO

## 2022-11-12 DIAGNOSIS — K641 Second degree hemorrhoids: Secondary | ICD-10-CM | POA: Diagnosis not present

## 2022-11-12 DIAGNOSIS — K921 Melena: Secondary | ICD-10-CM | POA: Diagnosis not present

## 2022-11-12 LAB — CBC WITH DIFFERENTIAL/PLATELET
Basophils Absolute: 0.1 10*3/uL (ref 0.0–0.1)
Basophils Relative: 1.4 % (ref 0.0–3.0)
Eosinophils Absolute: 0.2 10*3/uL (ref 0.0–0.7)
Eosinophils Relative: 2.4 % (ref 0.0–5.0)
HCT: 32.4 % — ABNORMAL LOW (ref 39.0–52.0)
Hemoglobin: 11 g/dL — ABNORMAL LOW (ref 13.0–17.0)
Lymphocytes Relative: 22.8 % (ref 12.0–46.0)
Lymphs Abs: 1.8 10*3/uL (ref 0.7–4.0)
MCHC: 34.1 g/dL (ref 30.0–36.0)
MCV: 98 fl (ref 78.0–100.0)
Monocytes Absolute: 0.5 10*3/uL (ref 0.1–1.0)
Monocytes Relative: 6.2 % (ref 3.0–12.0)
Neutro Abs: 5.2 10*3/uL (ref 1.4–7.7)
Neutrophils Relative %: 67.2 % (ref 43.0–77.0)
Platelets: 134 10*3/uL — ABNORMAL LOW (ref 150.0–400.0)
RBC: 3.3 Mil/uL — ABNORMAL LOW (ref 4.22–5.81)
RDW: 13.8 % (ref 11.5–15.5)
WBC: 7.7 10*3/uL (ref 4.0–10.5)

## 2022-11-12 NOTE — Telephone Encounter (Signed)
Called and spoke with patient. He is s/p 2nd hemorrhoid banding (10/31/22), and tolerated that well. Pt reports that yesterday he initially had a BM with no blood. Pt reports that yesterday he had an urgency to have a BM, but only passed liquid blood. Pt reports this happening about 15 times yesterday. Blood went from bright red to darker red, almost black. Patient states that in total yesterday he feels like he lost about a galloon of blood. Patient states that toward the end he started sweating, and felt lightheaded for about 5 minutes. Patient denied any abdominal pain yesterday at all. As of right now patient has not had any bowel movement or passed any blood. Pt is feeling better today, no abdominal pain. I told pt that you may likely order labs for him (did not see any recent labs). Pt is aware that I will call back with recommendations. Please advise, thanks.

## 2022-11-12 NOTE — Telephone Encounter (Signed)
See telephone encounter for details. Patient came in for CBC. How would you like to proceed?

## 2022-11-12 NOTE — Telephone Encounter (Signed)
Called and spoke with patient regarding recommendations. Pt will stop by for labs today. We will be in touch once results return. Pt verbalized understanding and had no concerns at the end of the call.   Lab order in epic.

## 2022-11-13 NOTE — Addendum Note (Signed)
Addended by: Missy Sabins on: 11/13/2022 08:41 AM   Modules accepted: Orders

## 2022-11-19 ENCOUNTER — Telehealth: Payer: Self-pay | Admitting: *Deleted

## 2022-11-19 NOTE — Telephone Encounter (Signed)
Called patient to remind of CBC bloodwork due this week. Patient states he will be in tomorrow.

## 2022-11-19 NOTE — Telephone Encounter (Signed)
-----   Message from Missy Sabins, RN sent at 11/19/2022  9:12 AM EDT ----- Regarding: FW: 1 week CBC  ----- Message ----- From: Missy Sabins, RN Sent: 11/19/2022  12:00 AM EDT To: Missy Sabins, RN Subject: 1 week CBC                                     CBC due - order in epic

## 2022-11-20 ENCOUNTER — Other Ambulatory Visit (INDEPENDENT_AMBULATORY_CARE_PROVIDER_SITE_OTHER): Payer: BC Managed Care – PPO

## 2022-11-20 DIAGNOSIS — K921 Melena: Secondary | ICD-10-CM | POA: Diagnosis not present

## 2022-11-20 DIAGNOSIS — K641 Second degree hemorrhoids: Secondary | ICD-10-CM | POA: Diagnosis not present

## 2022-11-20 LAB — CBC WITH DIFFERENTIAL/PLATELET
Basophils Absolute: 0.1 10*3/uL (ref 0.0–0.1)
Basophils Relative: 1.2 % (ref 0.0–3.0)
Eosinophils Absolute: 0.3 10*3/uL (ref 0.0–0.7)
Eosinophils Relative: 5.4 % — ABNORMAL HIGH (ref 0.0–5.0)
HCT: 34 % — ABNORMAL LOW (ref 39.0–52.0)
Hemoglobin: 11.6 g/dL — ABNORMAL LOW (ref 13.0–17.0)
Lymphocytes Relative: 23.1 % (ref 12.0–46.0)
Lymphs Abs: 1.5 10*3/uL (ref 0.7–4.0)
MCHC: 34.2 g/dL (ref 30.0–36.0)
MCV: 99.8 fl (ref 78.0–100.0)
Monocytes Absolute: 0.3 10*3/uL (ref 0.1–1.0)
Monocytes Relative: 4.8 % (ref 3.0–12.0)
Neutro Abs: 4.2 10*3/uL (ref 1.4–7.7)
Neutrophils Relative %: 65.5 % (ref 43.0–77.0)
Platelets: 163 10*3/uL (ref 150.0–400.0)
RBC: 3.41 Mil/uL — ABNORMAL LOW (ref 4.22–5.81)
RDW: 14.6 % (ref 11.5–15.5)
WBC: 6.5 10*3/uL (ref 4.0–10.5)

## 2022-12-31 ENCOUNTER — Encounter: Payer: Self-pay | Admitting: Gastroenterology

## 2022-12-31 ENCOUNTER — Other Ambulatory Visit (INDEPENDENT_AMBULATORY_CARE_PROVIDER_SITE_OTHER): Payer: BC Managed Care – PPO

## 2022-12-31 ENCOUNTER — Ambulatory Visit (INDEPENDENT_AMBULATORY_CARE_PROVIDER_SITE_OTHER): Payer: BC Managed Care – PPO | Admitting: Gastroenterology

## 2022-12-31 VITALS — BP 136/80 | HR 76 | Ht 66.0 in | Wt 257.0 lb

## 2022-12-31 DIAGNOSIS — Z860100 Personal history of colon polyps, unspecified: Secondary | ICD-10-CM

## 2022-12-31 DIAGNOSIS — D62 Acute posthemorrhagic anemia: Secondary | ICD-10-CM

## 2022-12-31 DIAGNOSIS — K641 Second degree hemorrhoids: Secondary | ICD-10-CM

## 2022-12-31 DIAGNOSIS — L0231 Cutaneous abscess of buttock: Secondary | ICD-10-CM

## 2022-12-31 LAB — CBC
HCT: 44.5 % (ref 39.0–52.0)
Hemoglobin: 15 g/dL (ref 13.0–17.0)
MCHC: 33.8 g/dL (ref 30.0–36.0)
MCV: 98.9 fl (ref 78.0–100.0)
Platelets: 149 10*3/uL — ABNORMAL LOW (ref 150.0–400.0)
RBC: 4.5 Mil/uL (ref 4.22–5.81)
RDW: 13.8 % (ref 11.5–15.5)
WBC: 6.9 10*3/uL (ref 4.0–10.5)

## 2022-12-31 MED ORDER — DOXYCYCLINE HYCLATE 100 MG PO TABS: 100.0000 mg | ORAL_TABLET | Freq: Two times a day (BID) | ORAL | 0 refills | Status: AC

## 2022-12-31 MED ORDER — AMOXICILLIN-POT CLAVULANATE 875-125 MG PO TABS: 1.0000 | ORAL_TABLET | Freq: Two times a day (BID) | ORAL | 0 refills | Status: DC

## 2022-12-31 NOTE — Progress Notes (Signed)
Chief Complaint:    Symptomatic Internal Hemorrhoids; Hemorrhoid Band Ligation  GI History: 42 y.o. male with a history of HTN, HLD, follows in the GI clinic for the following:   - 06/12/2022: Initial appointment in GI clinic for intermittent BRBPR on tissue paper for years, with worsening symptoms over the preceding 6 months.  No improvement with topical therapy. - 06/27/2022: Colonoscopy: 3 subcentimeter polyps (path: TA x 2, SSP x 1), 2 small 3-5 mm distal sigmoid hyperplastic polyps.  Biopsies negative for MC.  Small internal hemorrhoids.  Normal TI.  Repeat in 5 years. - 09/18/2022: Banding of LL hemorrhoid - 10/31/2022: Banding of RP hemorrhoid   Family history notable for mother with Ulcerative Colitis.  HPI:     Patient is a 42 y.o. malewith a history of symptomatic internal hemorrhoids presenting to the Gastroenterology Clinic for follow-up and ongoing treatment. The patient presents with symptomatic grade 2 hemorrhoids, unresponsive to maximal medical therapy, requesting rubber band ligation of symptomatic hemorrhoidal disease.  Since his last hemorrhoid banding on 6/20, he had an episode of brisk bleeding about 10 days later.  Hemoglobin 16 --> 11.  Bleeding stopped with conservative management.  He has taken oral iron every other day since then and no recurrence of bleeding.  Repeat Hgb the following week was uptrending at 11.6.  Separately, has intermittent draining abscess on left gluteus.  This is located away from his perianal area and unrelated to his hemorrhoidal symptoms.  No change in medical or surgical history, medications, allergies, social history since last appointment with me.   Review of systems:     No chest pain, no SOB, no fevers, no urinary sx   Past Medical History:  Diagnosis Date   Hyperlipidemia    Hypertension    Obesity    Sleep apnea 2016   "diagnosed with mild sleep apnea, no CPAP recommended    Patient's surgical history, family medical  history, social history, medications and allergies were all reviewed in Epic    Current Outpatient Medications  Medication Sig Dispense Refill   ketoconazole (NIZORAL) 2 % cream Apply 1 Application topically 2 (two) times daily.     doxycycline (VIBRA-TABS) 100 MG tablet Take 100 mg by mouth as needed. (Patient not taking: Reported on 12/31/2022)     rosuvastatin (CRESTOR) 10 MG tablet Take 1 tablet (10 mg total) by mouth daily. (Patient not taking: Reported on 12/31/2022) 90 tablet 3   No current facility-administered medications for this visit.    Physical Exam:     BP 136/80   Pulse 76   Ht 5\' 6"  (1.676 m)   Wt 257 lb (116.6 kg)   BMI 41.48 kg/m   GENERAL:  Pleasant male in NAD PSYCH: : Cooperative, normal affect ENEURO: Alert and oriented x 3, no focal neurologic deficits Rectal exam: Small, actively draining abscess in left gluteus, located 3-4 cm away from the perianal area.  Sensation intact and preserved anal wink.   No external anal fissures noted. Normal sphincter tone. No palpable mass. No blood on the exam glove. (Chaperone: Ailene Rud, CMA).   IMPRESSION and PLAN:    #1.  Symptomatic internal hemorrhoids: PROCEDURE NOTE: The patient presents with symptomatic grade 1-2 hemorrhoids, unresponsive to maximal medical therapy, requesting rubber band ligation of symptomatic hemorrhoidal disease.  All risks, benefits and alternative forms of therapy were described and informed consent was obtained.  In the Left Lateral Decubitus position, anoscopic examination revealed grade 1 hemorrhoids in the RA position(s).  The anorectum was pre-medicated with RectiCare. The decision was made to band the RA internal hemorrhoid, and the [facility] O'Regan System was used to perform band ligation without complication.  Digital anorectal examination was then performed to assure proper positioning of the band, and to adjust the banded tissue as required.  The patient was discharged home without  pain or other issues.  Dietary and behavioral recommendations were given and along with follow-up instructions.     The following adjunctive treatments were recommended:  -Resume high-fiber diet with fiber supplement (i.e. Citrucel or Benefiber) with goal for soft stools without straining to have a BM. -Resume adequate fluid intake.  The patient will return as needed if return of hemorrhoidal symptoms for follow-up and possible additional banding as required. No complications were encountered and the patient tolerated the procedure well.    #2.  Gluteal abscess - Doxycycline 100 mg bid x7 days - If recurring abscess, referral to surgery for I&D - Has sulfa allergy   #3.  Post hemorrhoid banding bleed #4.  Acute blood loss anemia Hemorrhoid banding in 10/2022 performed without issue, but with brisk bleeding about 10 days later and ABLA.  Treated conservatively and no recurrence of bleeding. - Repeat CBC checked today to ensure returning to baseline  #5.  History of colon polyps - Repeat colonoscopy 2029 for ongoing polyp surveillance        Shellia Cleverly ,DO, FACG 12/31/2022, 4:05 PM

## 2022-12-31 NOTE — Patient Instructions (Addendum)
Your provider has requested that you go to the basement level for lab work before leaving today. Press "B" on the elevator. The lab is located at the first door on the left as you exit the elevator.   We have sent the following medications to your pharmacy for you to pick up at your convenience:  Doxycyline  Follow up as needed. HEMORRHOID BANDING PROCEDURE    FOLLOW-UP CARE   The procedure you have had should have been relatively painless since the banding of the area involved does not have nerve endings and there is no pain sensation.  The rubber band cuts off the blood supply to the hemorrhoid and the band may fall off as soon as 48 hours after the banding (the band may occasionally be seen in the toilet bowl following a bowel movement). You may notice a temporary feeling of fullness in the rectum which should respond adequately to plain Tylenol or Motrin.  Following the banding, avoid strenuous exercise that evening and resume full activity the next day.  A sitz bath (soaking in a warm tub) or bidet is soothing, and can be useful for cleansing the area after bowel movements.     To avoid constipation, take two tablespoons of natural wheat bran, natural oat bran, flax, Benefiber or any over the counter fiber supplement and increase your water intake to 7-8 glasses daily.    Unless you have been prescribed anorectal medication, do not put anything inside your rectum for two weeks: No suppositories, enemas, fingers, etc.  Occasionally, you may have more bleeding than usual after the banding procedure.  This is often from the untreated hemorrhoids rather than the treated one.  Don't be concerned if there is a tablespoon or so of blood.  If there is more blood than this, lie flat with your bottom higher than your head and apply an ice pack to the area. If the bleeding does not stop within a half an hour or if you feel faint, call our office at [phone] or go to the emergency  room.  Problems are not common; however, if there is a substantial amount of bleeding, severe pain, chills, fever or difficulty passing urine (very rare) or other problems, you should call us at [phone] or report to the nearest emergency room.  Do not stay seated continuously for more than 2-3 hours for a day or two after the procedure.  Tighten your buttock muscles 10-15 times every two hours and take 10-15 deep breaths every 1-2 hours.  Do not spend more than a few minutes on the toilet if you cannot empty your bowel; instead re-visit the toilet at a later time.    Thank you for choosing me and [facility].  Vito Cirigliano, D.O.

## 2023-03-21 ENCOUNTER — Other Ambulatory Visit: Payer: Self-pay | Admitting: Medical Genetics

## 2023-03-21 DIAGNOSIS — Z006 Encounter for examination for normal comparison and control in clinical research program: Secondary | ICD-10-CM

## 2024-01-14 ENCOUNTER — Ambulatory Visit: Admitting: Gastroenterology

## 2024-01-14 ENCOUNTER — Encounter: Payer: Self-pay | Admitting: Gastroenterology

## 2024-01-14 VITALS — BP 122/88 | HR 68 | Ht 65.5 in | Wt 240.2 lb

## 2024-01-14 DIAGNOSIS — Z860100 Personal history of colon polyps, unspecified: Secondary | ICD-10-CM | POA: Diagnosis not present

## 2024-01-14 DIAGNOSIS — L0231 Cutaneous abscess of buttock: Secondary | ICD-10-CM | POA: Diagnosis not present

## 2024-01-14 DIAGNOSIS — K649 Unspecified hemorrhoids: Secondary | ICD-10-CM | POA: Diagnosis not present

## 2024-01-14 DIAGNOSIS — K641 Second degree hemorrhoids: Secondary | ICD-10-CM

## 2024-01-14 NOTE — Progress Notes (Signed)
 Chief Complaint:   Gluteal abscess  GI History: 43 y.o. male with a history of HTN, HLD, follows in the GI clinic for the following:   - 06/12/2022: Initial appointment in GI clinic for intermittent BRBPR on tissue paper for years, with worsening symptoms over the preceding 6 months.  No improvement with topical therapy. - 06/27/2022: Colonoscopy: 3 subcentimeter polyps (path: TA x 2, SSP x 1), 2 small 3-5 mm distal sigmoid hyperplastic polyps.  Biopsies negative for MC.  Small internal hemorrhoids.  Normal TI.  Repeat in 5 years. - 09/18/2022: Banding of LL hemorrhoid - 10/31/2022: Banding of RP hemorrhoid - Episode of brisk bleeding 10 days after hemorrhoid banding with hemoglobin 16 --> 11.  Bleeding stopped with conservative management, started oral iron with no further bleeding and normalization of hemoglobin at 15.0 one month later - 12/31/2022: Banding of RA hemorrhoid   Family history notable for mother with Ulcerative Colitis.  HPI:     Patient is a 43 y.o. male presenting to the Gastroenterology Clinic for evaluation of gluteal abscess.  At the time of his last appointment on 12/31/2022, he was diagnosed with left gluteal abscess located away from the perianal area and not related to his hemorrhoids.  This was actively draining and had treated with doxycycline .  Since then, has continued to have relapsing abscess symptoms.  Will have occasions of low-volume drainage, then 1-2 episodes a month of large-volume drainage and bleeding to the point of soaking his boxers and pants with drainage/blood.  No fever.  Does have slight discomfort in the area.  Has had taken doxycycline  since then as well.  He is otherwise without hemorrhoidal symptoms since banding.  Otherwise no changes in bowel habits, abdominal pain, and no upper GI symptoms.  No new labs or abdominal imaging for review.  Review of systems:     No chest pain, no SOB, no fevers, no urinary sx   Past Medical History:  Diagnosis  Date   Hyperlipidemia    Hypertension    Obesity    Sleep apnea 2016   diagnosed with mild sleep apnea, no CPAP recommended    Patient's surgical history, family medical history, social history, medications and allergies were all reviewed in Epic    Current Outpatient Medications  Medication Sig Dispense Refill   clobetasol (TEMOVATE) 0.05 % external solution Apply 1 Application topically 2 (two) times daily.     ketoconazole (NIZORAL) 2 % cream Apply 1 Application topically 2 (two) times daily.     No current facility-administered medications for this visit.    Physical Exam:     BP 122/88 (BP Location: Left Arm, Patient Position: Sitting, Cuff Size: Large)   Pulse 68   Ht 5' 5.5 (1.664 m) Comment: height measured without shoes  Wt 240 lb 4 oz (109 kg)   BMI 39.37 kg/m   GENERAL:  Pleasant male in NAD PSYCH: : Cooperative, normal affect Musculoskeletal:  Normal muscle tone, normal strength NEURO: Alert and oriented x 3, no focal neurologic deficits Rectal exam: Approximately 2.5 cm area of induration with pinpoint area of drainage in left gluteus, located 3-4 cm away from the perianal area.  Sensation intact and preserved anal wink.   No external anal fissures or hemorrhoids noted. (Chaperone: Nat Sic, CMA).    IMPRESSION and PLAN:    1) Gluteal abscess - ASAP referral to surgical clinic for evaluation and I&D  2) Internal hemorrhoids - Good response to hemorrhoid banding series without recurrence of  symptomatic hemorrhoids  3) History of colon polyps - Repeat colonoscopy 2029 for ongoing polyp surveillance  I spent 25 minutes of time, including independent review of results as outlined above, communicating results with the patient directly, face-to-face time with the patient, coordinating care, ordering studies and medications as appropriate, and documentation.      Willie Osborne ,DO, FACG 01/14/2024, 9:02 AM

## 2024-01-14 NOTE — Patient Instructions (Addendum)
 _______________________________________________________  If your blood pressure at your visit was 140/90 or greater, please contact your primary care physician to follow up on this.  _______________________________________________________  If you are age 45 or older, your body mass index should be between 23-30. Your Body mass index is 39.37 kg/m. If this is out of the aforementioned range listed, please consider follow up with your Primary Care Provider.  If you are age 30 or younger, your body mass index should be between 19-25. Your Body mass index is 39.37 kg/m. If this is out of the aformentioned range listed, please consider follow up with your Primary Care Provider.   ________________________________________________________  The [facility] providers would like to encourage you to use MYCHART to communicate with providers for non-urgent requests or questions.  Due to long hold times on the telephone, sending your provider a message by [facility] may be a faster and more efficient way to get a response.  Please allow 48 business hours for a response.  Please remember that this is for non-urgent requests.  _______________________________________________________  Rosine have been scheduled for an appointment with __________ at [facility]. Your appointment is on ________ at _______. Please arrive at ________ for registration. Make certain to bring a list of current medications, including any over the counter medications or vitamins. Also bring your co-pay if you have one as well as your insurance cards. [facility] is located at [address]. Should you need to reschedule your appointment, please contact them at [phone].  [facility] is using a team-based approach to care.  Your team is made up of your doctor and two to three APPS. Our APPS (Nurse Practitioners and Physician Assistants) work with your physician to ensure care continuity  for you. They are fully qualified to address your health concerns and develop a treatment plan. They communicate directly with your gastroenterologist to care for you. Seeing the Advanced Practice Practitioners on your physician's team can help you by facilitating care more promptly, often allowing for earlier appointments, access to diagnostic testing, procedures, and other specialty referrals.   It was a pleasure to see you today!  Vito Cirigliano, D.O.

## 2024-01-20 ENCOUNTER — Ambulatory Visit: Payer: Self-pay | Admitting: Surgery

## 2024-01-20 DIAGNOSIS — K625 Hemorrhage of anus and rectum: Secondary | ICD-10-CM | POA: Insufficient documentation

## 2024-01-20 DIAGNOSIS — K603 Anal fistula, unspecified: Secondary | ICD-10-CM | POA: Insufficient documentation

## 2024-01-20 DIAGNOSIS — E66812 Obesity, class 2: Secondary | ICD-10-CM | POA: Insufficient documentation

## 2024-03-07 ENCOUNTER — Other Ambulatory Visit: Payer: Self-pay | Admitting: Medical Genetics

## 2024-03-07 DIAGNOSIS — Z006 Encounter for examination for normal comparison and control in clinical research program: Secondary | ICD-10-CM

## 2024-03-18 ENCOUNTER — Other Ambulatory Visit: Payer: Self-pay | Admitting: Surgery

## 2024-03-19 LAB — SURGICAL PATHOLOGY

## 2024-04-15 NOTE — Patient Instructions (Addendum)
 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  Your external wound is rather superficial and closing down.  Keep this clean and dry.  See information below.  Follow-up next month and hopefully at that point the external wound is fully closed down.  ######################################################################   CONTROL PAIN Control pain so that you can walk, sleep, tolerate sneezing/coughing, go up/down stairs.  (Good pain control is not pain free only when lying still, unable to move)   Do not push through pain.  If it hurts to do it, then don't do it.  EAT Gradually transition to a high fiber diet with a fiber supplement over the next few days after discharge.   Try 5-6 smaller meals at first.    WALK Walk an hour a day.   Control your pain to do that.   HAVE A BOWEL MOVEMENT DAILY Keep your bowels regular to avoid problems.   OK to try a laxative to override constipation.   OK to use an antidairrheal to slow down diarrhea.   Call if not better after 2 tries  CALL IF YOU HAVE PROBLEMS/CONCERNS Call if you are still struggling despite following these instructions. Call if you have concerns not answered by these instructions  #############################################################################   Soreness, decreased appetite, and poor energy level are common problems after surgery.  While many people can struggle with a bad day, these concerns should gradually fade away in a few months.  Much of your recovery depends on your health & the severity of your operation.   Increase activity as tolerated to regular everyday activity.  Consider daily low impact exercise every day such as walking an hour a day.    Diet as tolerated.  Low fat high fiber diet ideal.   30 g fiber a day ideal.  Consider taking a daily fiber supplement to keep your bowels regular.  Please call if you have any further questions / concerns related to  surgery.  Followup with your primary care physician for other health issues as would normally be done.    Consider screening for cancers (breast, prostate, colon, lung, melanoma, etc) as appropriate.  Discuss with your primary care physician.   ##########################################################################################################   Anal Abscess/Fistula  What is an anal abscess? An anal abscess is an infected cavity filled with pus found near the anus or rectum.  What is the treatment of an anal abscess? The treatment of an abscess is surgical drainage under most circumstances. An incision is made in the skin near the anus to drain the infection. This can be done in a doctor's office with local anesthetic or in an operating room under deeper anesthesia. Hospitalization may be required for patients prone to more significant infections such as diabetics or patients with decreased immunity.  Are antibiotics required to treat this type of infection? Antibiotics alone are a poor alternative to drainage of the infection. For uncomplicated abscesses, the addition of antibiotics to surgical drainage does not improve healing time or reduce the potential for recurrences. There are some conditions in which antibiotics are indicated, such as for patients with compromised or altered immunity, some cardiac valvular conditions or extensive cellulitis. A comprehensive discussion of your past medical history and a physical exam are important to determine if antibiotics are indicated.  What is an anal fistula? An anal fistula (also called fistula-in-ano) is frequently the result of a previous or current anal abscess, occurring in up to 50% of patients with abscesses. Normal anatomy  includes small glands just inside the anus. Occasionally, these glands get clogged and potentially can become infected, leading to an abscess. The fistula is a tunnel that forms under the skin and connects the  infected glands to the abscess. A fistula can be present with or without an abscess and may connect just to the skin of the buttocks near the anal opening. Other situations that can result in a fistula include Crohn's disease, radiation, trauma and malignancy.  How does someone get an anal abscess or a fistula? The abscess is most often a result of an acute infection in the internal glands of the anus. Occasionally, bacteria, fecal material or foreign matter can clog the anal gland and create a condition for an abscess cavity to form. Other medical conditions can make these types of infections more likely.  After an abscess drains on its own or has been drained (opened), a tunnel (fistula) may persist, connecting the infected anal gland to the external skin. This typically will involve some type of drainage from the external opening and occurs in up to 50% of abscesses. If the opening on the skin heals when a fistula is present, a recurrent abscess may develop.  What are the specific signs or symptoms of an abscess or fistula? A patient with an abscess may have pain, redness or swelling in the area around the anal area. Fatigue, general malaise, as well as accompanying fever or chills are also common. Similar signs and symptoms may be present when patients have a fistula, with the addition of possible irritation of the perianal skin or drainage from an external opening.  Is any specific testing necessary to diagnose an abscess or fistula? No. Most anal abscesses or fistula-in-ano are diagnosed and managed on the basis of clinical findings. Occasionally, additional studies such as ultrasound, CT scan, or MRI can assist with the diagnosis of deeper abscesses or the delineation of the fistula tunnel to help guide treatment.    What is the treatment of an anal fistula? Surgery is almost always necessary to cure an anal fistula. Although surgery can be fairly straightforward, it may also be complicated,  occasionally requiring staged or multiple operations. Consider identifying a specialist in colon and rectal surgery who would be familiar with a number of potential operations to treat the fistula. The surgery may be performed at the same time as drainage of an abscess, although sometimes the fistula doesn't appear until weeks to years after the initial drainage. If the fistula is straightforward, a fistulotomy may be performed. This procedure involves connecting the internal opening within the anal canal to the external opening, creating a groove that will heal from the inside out. This surgery often will require dividing a small portion of the sphincter muscle which has the unlikely potential for affecting the control of bowel movements in a limited number of cases.  Other procedures include placing material within the fistula tract to occlude it or surgically altering the surrounding tissue to accomplish closure of the fistula, with the choice of procedure depending upon the type, length, and location of the fistula. Most of the operations can be performed on an outpatient basis, but may occasionally require hospitalization.  What is the recovery like from surgery? Pain after surgery is controlled with pain pills, fiber and bulk laxatives.  Pain can be sharp requiring prescription medications especially the first couple of weeks.  Usually there is a chronic wound that needs to gradually close over couple months.  Patients should plan for  time at home using sitz baths and attempt to avoid the constipation that can be associated with prescription pain medication. Discuss with your surgeon the specific care and time away from work prior to surgery to prepare yourself for post-operative care.  Can the abscess or fistula recur? If adequately treated and properly healed, both are unlikely to return. However, despite proper and indicated open or minimally invasive treatment, both abscesses and fistulas can  potentially recur. Should similar symptoms arise, suggesting recurrence, it is recommended that you find a colon and rectal surgeon to manage your condition.  What is a colon and rectal surgeon? Colon and rectal surgeons are experts in the surgical and non-surgical treatment of diseases of the colon, rectum and anus. They have completed advanced surgical training in the treatment of these diseases as well as full general surgical training. Board-certified colon and rectal surgeons complete residencies in general surgery and colon and rectal surgery, and pass intensive examinations conducted by the American Board of Surgery and the American Board of Colon and Rectal Surgery. They are well-versed  in the treatment of both benign and malignant diseases of the colon, rectum and anus and are able to perform routine screening examinations and surgically treat conditions if indicated to do so.    ##############################################  ANORECTAL SURGERY:  POST OPERATIVE INSTRUCTIONS  ######################################################################  EAT Start with a pureed / full liquid diet After 24 hours, gradually transition to a high fiber diet.    CONTROL PAIN Control pain so you can tolerate bowel movements,  walk, sleep, tolerate sneezing/coughing, and go up/down stairs.   HAVE A BOWEL MOVEMENT DAILY Keep your bowels regular to avoid problems.   Taking a fiber supplement 2-4 doses every day to keep bowels soft.   HAVE A BM BY THE SECOND POSTOPERATIVE DAY Use an antidairrheal to slow down diarrhea.   Call if not better after 2 tries  WALK Walk an hour a day.  Control your pain to do that.   CALL IF YOU HAVE PROBLEMS/CONCERNS Call if you are still struggling despite following these instructions. Call if you have concerns not answered by these instructions  ######################################################################    Take your usually prescribed home  medications unless otherwise directed.  DIET: Follow a light bland diet & liquids the first 24 hours after arrival home, such as soup, liquids, starches, etc.  Be sure to drink plenty of fluids.  Quickly advance to a usual solid diet within a few days.  Avoid fast food or heavy meals as your are more likely to get nauseated or have irregular bowels.  A low-fat, high-fiber diet for the rest of your life is ideal.  Control pain to avoid problems with urinating or defecating:  Pain is best controlled by a usual combination of many methods TOGETHER: Warm baths/soaks or Ice packs Over the counter pain medication Prescription pain medications Topical creams  A.  Warm water baths or ice packs (30-60 minutes up to 8 times a day, especially after bowel meovements) will help.  Using ice for the first few days can help decrease swelling and bruising,  Use heat such as warm towels, sitz baths, warm baths, warm showers, etc to help relax tight/sore spots and speed recovery.   Some people prefer to use ice alone, heat alone, alternating between ice & heat.  Experiment to what works for you.    It is helpful to take an over-the-counter pain medication continuously for the first few weeks.  This helps people need to use less  narcotics Choose one of the following that works best for you: Naproxen (Aleve, etc)  Two 220mg  tabs twice a day Ibuprofen (Advil, etc) Three 200mg  tabs four times a day (every meal & bedtime) Acetaminophen  (Tylenol , etc) 500-650mg  four times a day (every meal & bedtime)  A  prescription for pain medication (such as oxycodone, hydrocodone , etc) should be given to you upon discharge.  Take your pain medication as prescribed.  If you are having problems/concerns with the prescription medicine (does not control pain, nausea, vomiting, rash, itching, etc), please call us  [phone] to see if we need to switch you to a different pain medicine that will work better for you and/or control  your side effect better. Most people will need 1-2 refills to get through the first couple of weeks when pain is the most intense.  If you need a refill on your pain medication, please contact your pharmacy.  They will contact our office to request authorization. Prescriptions will not be filled after 5 pm or on week-ends.  If can take up to 48 hours for it to be filled & ready so avoid waiting until you are down to thel ast pill.  A numbing topical cream (Dibucaine) may be given to you.  Many people find relief with topical creams.  Some people find it burns too much.  Experiment.  If it helps, use it.  If it burns, stop using it.  You also may receive a prescription for diazepam, a muscle relaxant to help you to be able to urinate at first easily.  It is safe to take a few doses with the other medications as long as you are not planning to drive or do anything intense.  Hopefully this can minimize the chance of needing a Foley catheter into your bladder     Have a bowel movement every day  Until you have a bowel movement after surgery, he will struggle with urinating and controlling her pain.  Take a fiber supplement (such as Metamucil, Citrucel, FiberCon, MiraLax, etc) 2-4 doses a day to help prevent constipation from occurring.     If you have not had a bowel movement the second postoperative day:  -switch to drinking liquids only -take MiraLAX double dose every 2 hours x 3 or until you have a BM -If that does not work x 3 doses, increase to 4 doses every 2 hours (Like a small bowel prep) until you have a bowel movement. -Avoid enemas or suppositories (can harm sutures/repair) -Once you start having bowel movements, adjust your fiber supplement or Miralax dosing back down until you are having 1-2 soft well formed BMs a day.  (Start at 2 doses a day & adjust up or down to avoid diarrhea or recurrent constipation)   Watch out for diarrhea.  If you have many loose bowel movements, simplify  your diet to bland foods & liquids for a few days.  Stop any stool softeners and decrease your fiber supplement.  Switching to mild anti-diarrheal medications (Kayopectate, Pepto Bismol) can help.  Can try an imodium/loperamide dose.  If this worsens or does not improve, please call us .  Wound Care   a. You have some fluffed cotton on top of the anus to help catch drainage and bleeding.  THERE IS NO PACKING INSIDE THE RECTUM Let the cotton fall off with the first bowel movement or shower.  It is okay to reinforce or replace as needed.  Bleeding is common at first and occasionally tapers off  after a few weeks   b. Place soft cotton balls on the anus/wounds and use an absorbent pad in your underwear as needed to catch any drainage and help keep the area.  Try to use cotton balls or pads (regular gauze or toilet paper will stick and pull, causing pain.  Cotton will come off more easily).  OK to try dry powders such as cornstarch or baby powders   c. Keep the area clean and dry.  Bathe / shower every day.  Keep the area clean by showering / bathing over the incision / wound.   It is okay to soak an open wound to help wash it.  Consider using a squeeze bottle filled with warm water to gently wash the anal area.  Wet wipes or showers / gentle washing after bowel movements is often less traumatic than regular toilet paper.           D.  Use a Sitz Bath 4-8 times a day for relief  A sitz bath is a warm water bath taken in the sitting position that covers only the hips and buttocks. It may be used for either healing or hygiene purposes. Sitz baths are also used to relieve pain, itching, or muscle spasms.  Gently cleaned the area and the heat will help lower spasm and offer better pain control.    Fill the bathtub half full with warm water. Sit in the water and open the drain a little. Turn on the warm water to keep the tub half full. Keep the water running constantly. Soak in the water for 15 to 20  minutes. After the sitz bath, pat the affected area dry first.   d. You will often notice bleeding, especially with bowel movements.  This should slow down by the end of the first week of surgery.  It can take over a month to more fully stop.  Sitting on an ice pack can help.   e. Expect some drainage.  You often will have some blood or yellow drainage with open wounds.  Sometimes she will get a little leaking of liquid stool until the incision/wounds have fully close down.  This should slow down by the end of the first week of surgery, but you will have occasional bleeding or drainage up to a few months after surgery until all incisions & wounds have closed.  Wear an absorbent pad or soft cotton gauze in your underwear until the drainage stops.  ACTIVITIES as tolerated:    You may resume regular (light) daily activities beginning the next day--such as daily self-care, walking, climbing stairs--gradually increasing activities as tolerated.  If you can walk 30 minutes without difficulty, it is safe to try more intense activity such as jogging, treadmill, bicycling, low-impact aerobics, swimming, etc. Save the most intensive and strenuous activity for last such as sit-ups, heavy lifting, contact sports, etc  Refrain from any heavy lifting or straining until you are off narcotics for pain control.   DO NOT PUSH THROUGH PAIN.  Let pain be your guide: If it hurts to do something, don't do it.  Pain is your body warning you to avoid that activity for another week until the pain goes down. You may drive when you are no longer taking prescription pain medication, you can comfortably sit for long periods of time, and you can safely maneuver your car and apply brakes. You may have sexual intercourse when it is comfortable.   6.  FOLLOW UP in our office Please  call [facility] at [phone] to set up an appointment to see your surgeon in the office for a follow-up appointment approximately 3 weeks after your  surgery. If tissue is sent for pathology, it usually takes a week for pathology results to come back.  We will make an effort to call you with results as soon as we get them.  If you have not heard back in a week and wish to know the results before your postop visit, please call our office and we will see if we can give feedback on the results Make sure that you call for this appointment the day you arrive home to ensure a convenient appointment time.  7. IF YOU HAVE DISABILITY OR FAMILY LEAVE FORMS, BRING THEM TO THE OFFICE FOR PROCESSING.  DO NOT GIVE THEM TO YOUR DOCTOR.        WHEN TO CALL US  [phone]: Poor pain control Reactions / problems with new medications (rash/itching, nausea, etc)  Fever over 101.5 F (38.5 C) Inability to urinate Nausea and/or vomiting Worsening swelling or bruising Continued bleeding from incision. Increased pain, redness, or drainage from the incision  The clinic staff is available to answer your questions during regular business hours (8:30am-5pm).  Please dont hesitate to call and ask to speak to one of our nurses for clinical concerns.   A surgeon from [facility] is always on call at the hospitals   If you have a medical emergency, go to the nearest emergency room or call 911.    [facility] [address], [place], [place]  [address] ? MAIN: [phone] ? TOLL FREE: [phone] ? FAX [phone] [email or web address]  #####################################################

## 2024-04-15 NOTE — Progress Notes (Signed)
 PATIENT NAME: Willie Osborne MRN: [id number] DOB: 1980/06/09 PHYSICIANS:  REFERRING PHYSICIAN:  Self  CARE TEAM:   Patient Care Team: Duanne Butler DASEN, MD as PCP - General (Family Medicine) [place], DO as Referring Physician  CONSULTING PROVIDER:  ELSPETH JUDAH SCHULTZE, MD  DATE OF ENCOUNTER: 04/15/2024    Interval History:   The patient returns to the office after undergoing LIFT repair of intersphincteric fistula with hemorrhoidal ligation & pexy at [facility].  On 03/18/2024  Willie Osborne: External fistula and internal fistula tracts with benign squamous epithelium with underlying acute and chronic inflammation consistent with fistula.  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  Grade 2 and 3 internal hemorrhoids.  Left lateral greater than right posterior greater than right anterior.  Ligation/pexy only done.  Level of Interpreter Services: No interpreter needed (no language barrier)  Patient returns.  He is moving his bowels every day.  He is trying to use pull-ups just in case.  He tried to go back to work but felt some scrotal and left testicular swelling and discomfort for about a week.  Concerned him.  It slowly getting better.  No hematuria or pyuria.  No difficulty urinating.  No fall or trauma or activity down there.  He is off narcotics.  Still feels a wound with some occasional bleeding and drainage.  Not really putting anything on there right now.  Neither powders nor creams      Labs, Imaging and Diagnostic Testing:  Located in 'Care Everywhere' section of Epic EMR chart   [facility] NOTES:  Located in 'Care Everywhere' section of Epic EMR chart   SURGERY NOTES:  Located in 'Care Everywhere' section of Epic EMR chart   Willie Osborne:  Located in 'Care Everywhere' section of Epic EMR chart     Physical Examination:   There is no height or weight on file to  calculate BMI.  Constitutional: Not cachectic.  Hygeine adequate.   Eyes: Normal extraocular movements. Sclera nonicteric Neuro: No major focal sensory defects.  No major motor deficits. Psych:  No severe agitation.  No severe anxiety.  Judgment & insight Adequate, Oriented x4, HENT: Normocephalic, Mucus membranes moist.  No thrush.   Neck: Supple, No tracheal deviation.   Chest: Good respiratory excursion.  No audible wheezing CV:  No major extremity edema Ext: No obvious deformity or contracture.  Edema: not present.  No cyanosis Skin:  Warm and dry Musculoskeletal:   Mobility: no assist device moving easily without restrictions Abdomen: Nontender.  Soft.   Nondistended.   GU:  No bleeding nor discharge.  Inguinal hernia: Not present.  Inguinal lymph nodes: without lymphadenopathy.    ##################################     Wound Clean & granulating with normal healing ridge 3cm by 2.5cm,superficial at left lateral  Perianal skin: Clean with good hygiene  Pruritis ani: Mild Anal fissure: Not present Perirectal abscess/fistula: Not present External hemorrhoids: Not present Sphincter tone Increased  Digital and anoscopic rectal exam: Deferred     ###################################   Assessment and Plan:   Willie Osborne is a 43 y.o. male recovering s/p LIFT repair of left sided intersphincteric fistula with some hemorrhoidal ligation on 03/18/2024  Willie Osborne consistent with benign fistula tract.  No concerns of malignancy, dysplasia, inflammatory bowel disease  There are no diagnoses linked to this encounter.   Considering he is not quite a month out, he is doing relatively well.  Noted he can take  6 to 12 weeks for the external wound to fully close down.  Recommend he do powders to keep it clean and dry.  To try to avoid any antibiotic creams.  Fiber bowel regimen to avoid extremes of constipation or diarrhea.  Some encopresis urgency can happen the first  couple of months but should continue to improve.  That does not seem the issue.  His major concern was he had some scrotal and left testicular swelling when he went back to doing some activity.  Took a week for it to calm down.  Recommend he do trial of nonsteroidal such as ibuprofen or Aleve around-the-clock for the next 2 weeks to take the edge off and see if that helps.  If he gets markedly worse, may need to see urology.  He is not have any pyuria or hematuria.  It is naturally getting better already just not fully resolved.  Just want to double check thanks.  There is always a chance of recurrent fistulas but hopefully he is in the 85% that will heal appropriately.  External wound looks very superficial at this point and broad and flat.  He does not smoke with diabetes nor inflammatory bowel disease.  Hopefully he will be in majority of folks but we will see.   Have him come back in 3 to 4 weeks.  Hopefully at that point the external wound is closed down.  No follow-ups on file.   The plan was discussed in detail with the patient today, who expressed understanding & appreciation.  The patient has my contact information, and understands to call me with any additional questions or concerns in the interval.  I would be happy to see the patient back sooner if the need arises.  Of note, portions of this report may have been transcribed using voice recognition software. Every effort was made to ensure accuracy; however, inadvertent computerized transcription errors may be present.   Any transcriptional errors that result from this process are unintentional.   Elspeth KYM Schultze, MD, FACS, MASCRS Esophageal, Gastrointestinal & Colorectal Surgery Robotic and Minimally Invasive Surgery  [facility]  [address], [address] [place], [place] [address] [phone] Fax [phone] Main              04/15/2024

## 2024-04-21 NOTE — Progress Notes (Signed)
" ° °  PROVIDER:  PUJA GOSAI MACZIS, PA  MRN: [id number] DOB: Jun 23, 1980 DATE OF ENCOUNTER: 04/21/2024 Interval History:   Willie Osborne is a 43 y.o. male who underwent lift repair of intersphincteric fistula with hemorrhoidal ligation and pexy at [facility] on 03/18/2024 by Dr. Sheldon.  He was seen for a postop visit on 04/15/2024 at which time he voiced concerns about swelling on the left perineal/testicular area several days prior.  There were no signs of infection at that time as the issue was resolving.  He states earlier this week the same area became flared up.  He noticed redness, swelling, and drainage.  A large amount of bloody/mucus type drainage occurred yesterday.  The area is less swollen and painful now.  He denies fever.  Denies nausea, vomiting.  Denies issues with his bowel movements.  Review of Systems:   ROS All other systems reviewed and are negative.  Medications:   Current Outpatient Medications on File Prior to Visit  Medication Sig Dispense Refill   clobetasoL (CORMAX) 0.05 % external solution Apply 1 Application topically 2 (two) times daily     oxyCODONE-acetaminophen  (PERCOCET) 5-325 mg tablet Take 1 tablet by mouth every 6 (six) hours as needed for Pain 30 tablet 0   No current facility-administered medications on file prior to visit.    Physical Examination:   BP (!) 152/89   Pulse 83   Temp 36.1 C (97 F)   Ht 167.6 cm (5' 6)   Wt (!) 113.4 kg (250 lb)   SpO2 99%   BMI 40.35 kg/m   General: Well-developed, well-nourished, in no acute distress.   Left lateral perianal wound is about 2 cm x 1 cm, superficial, healing well without surrounding erythema or drainage On the left perineum, there are 2 pinpoint openings with some surrounding inflammation but no overlying cellulitis or active drainage.  No tenderness to palpation   A chaperone, Jordyn Sharpe, CMA, was present during the exam.    Assessment and Plan:   Diagnoses  and all orders for this visit:  Intersphincteric fistula  Perianal abscess    Quill Grinder is status post lift repair of intersphincteric fistula with hemorrhoidal ligation and pexy at [facility] on 03/18/2024 by Dr. Sheldon.  On exam today, he has 2 small openings from an area that spontaneously drained yesterday in his left perineum.  I do not feel any undrained fluid collection.  There is no overlying cellulitis.  I do not believe he needs a procedure or antibiotics at this time, but he will let us  know if he develops any worsening issues before follow-up appointment with Dr. Sheldon.  Return for appt with surgeon as planned.  Puja Maczis, [facility] "

## 2024-05-17 DIAGNOSIS — R233 Spontaneous ecchymoses: Secondary | ICD-10-CM | POA: Insufficient documentation

## 2024-05-21 ENCOUNTER — Ambulatory Visit: Admitting: Family Medicine

## 2024-05-21 ENCOUNTER — Encounter: Payer: Self-pay | Admitting: Family Medicine

## 2024-05-21 VITALS — BP 160/102 | HR 73 | Temp 97.8°F | Ht 65.5 in | Wt 242.0 lb

## 2024-05-21 DIAGNOSIS — F419 Anxiety disorder, unspecified: Secondary | ICD-10-CM | POA: Diagnosis not present

## 2024-05-21 DIAGNOSIS — I10 Essential (primary) hypertension: Secondary | ICD-10-CM | POA: Diagnosis not present

## 2024-05-21 LAB — COMPREHENSIVE METABOLIC PANEL WITH GFR
AG Ratio: 1.7 (calc) (ref 1.0–2.5)
ALT: 76 U/L — ABNORMAL HIGH (ref 9–46)
AST: 59 U/L — ABNORMAL HIGH (ref 10–40)
Albumin: 4.6 g/dL (ref 3.6–5.1)
Alkaline phosphatase (APISO): 71 U/L (ref 36–130)
BUN: 10 mg/dL (ref 7–25)
CO2: 28 mmol/L (ref 20–32)
Calcium: 9.1 mg/dL (ref 8.6–10.3)
Chloride: 101 mmol/L (ref 98–110)
Creat: 0.86 mg/dL (ref 0.60–1.29)
Globulin: 2.7 g/dL (ref 1.9–3.7)
Glucose, Bld: 112 mg/dL — ABNORMAL HIGH (ref 65–99)
Potassium: 4.6 mmol/L (ref 3.5–5.3)
Sodium: 139 mmol/L (ref 135–146)
Total Bilirubin: 0.6 mg/dL (ref 0.2–1.2)
Total Protein: 7.3 g/dL (ref 6.1–8.1)
eGFR: 109 mL/min/1.73m2

## 2024-05-21 MED ORDER — DIAZEPAM 5 MG PO TABS: 5.0000 mg | ORAL_TABLET | Freq: Three times a day (TID) | ORAL | 0 refills | Status: DC | PRN

## 2024-05-21 MED ORDER — LOSARTAN POTASSIUM 100 MG PO TABS: 100.0000 mg | ORAL_TABLET | Freq: Every day | ORAL | 3 refills | Status: AC

## 2024-05-21 MED ORDER — ESCITALOPRAM OXALATE 10 MG PO TABS: 10.0000 mg | ORAL_TABLET | Freq: Every day | ORAL | 5 refills | Status: AC

## 2024-05-21 NOTE — Progress Notes (Signed)
 "  Subjective:    Patient ID: Willie Osborne, male    DOB: 05/27/1980, 44 y.o.   MRN: [id number]  Hypertension  Patient's blood pressure today is extremely high 160/102.  He has been getting similar readings at other doctor offices.  Previously his blood pressure has been well-controlled.  His blood pressure typically is 130/80.  However over the last several months he has had severe uncontrolled anxiety.  There is been tremendous stress at work.  He had a job change.  He has separated from his girlfriend.  Her father has terminal cancer.  There is a lot of stress on his family [place].  All of this is causing his anxiety level to be extreme.  He is having occasional panic attacks.  He is having trouble sleeping due to his mind racing.  He believes that this may be affecting his blood pressure.  He states that on a daily basis he feels uncontrolled anxiety.  He denies depression.  He denies any chest pain or shortness of breath Past Medical History:  Diagnosis Date   Hyperlipidemia    Hypertension    Obesity    Sleep apnea 2016   diagnosed with mild sleep apnea, no CPAP recommended   Past Surgical History:  Procedure Laterality Date   CARPAL TUNNEL RELEASE Right    COLONOSCOPY     EAR CYST EXCISION N/A 02/25/2014   Procedure: EXCISION CYST, SCROTUM;  Surgeon: Oneil DELENA Budge, MD;  Location: [facility];  Service: General;  Laterality: N/A;   Current Outpatient Medications on File Prior to Visit  Medication Sig Dispense Refill   diazepam  (VALIUM ) 5 MG tablet Take 5 mg by mouth 3 (three) times daily as needed.     oxyCODONE-acetaminophen  (PERCOCET/ROXICET) 5-325 MG tablet Take 1 tablet by mouth.     clobetasol (TEMOVATE) 0.05 % external solution Apply 1 Application topically 2 (two) times daily.     ketoconazole (NIZORAL) 2 % cream Apply 1 Application topically 2 (two) times daily.     No current facility-administered medications on file prior to visit.   Allergies  Allergen Reactions    Lipitor [Atorvastatin Calcium ] Other (See Comments)    neuralgia   Elemental Sulfur    Sulfamethoxazole Rash   Social History   Socioeconomic History   Marital status: Single    Spouse name: Not on file   Number of children: 0   Years of education: Not on file   Highest education level: Not on file  Occupational History   Occupation: outside sales  Tobacco Use   Smoking status: Former    Current packs/day: 0.00    Average packs/day: 0.3 packs/day for 5.0 years (1.3 ttl pk-yrs)    Types: Cigarettes    Start date: 11/21/2008    Quit date: 11/21/2013    Years since quitting: 10.5   Smokeless tobacco: Never  Vaping Use   Vaping status: Former  Substance and Sexual Activity   Alcohol use: Yes    Comment: 2-3 beers week   Drug use: No   Sexual activity: Yes    Birth control/protection: None  Other Topics Concern   Not on file  Social History Narrative   Not on file   Social Drivers of Health   Tobacco Use: Low Risk  (05/17/2024)   Received from [facility]   Patient History    Smoking Tobacco Use: Never    Smokeless Tobacco Use: Never    Passive Exposure: Not on file  Financial  Resource Strain: Patient Declined (05/20/2024)   Overall Financial Resource Strain (CARDIA)    Difficulty of Paying Living Expenses: Patient declined  Food Insecurity: Patient Declined (05/20/2024)   Epic    Worried About Programme Researcher, Broadcasting/film/video in the Last Year: Patient declined    Barista in the Last Year: Patient declined  Transportation Needs: Patient Declined (05/20/2024)   Epic    Lack of Transportation (Medical): Patient declined    Lack of Transportation (Non-Medical): Patient declined  Physical Activity: Insufficiently Active (05/20/2024)   Exercise Vital Sign    Days of Exercise per Week: 4 days    Minutes of Exercise per Session: 30 min  Stress: Stress Concern Present (05/20/2024)   [employer] of Occupational Health - Occupational Stress Questionnaire    Feeling  of Stress: To some extent  Social Connections: Unknown (05/20/2024)   Social Connection and Isolation Panel    Frequency of Communication with Friends and Family: More than three times a week    Frequency of Social Gatherings with Friends and Family: More than three times a week    Attends Religious Services: More than 4 times per year    Active Member of [employer] or Organizations: Yes    Attends Banker Meetings: More than 4 times per year    Marital Status: Patient declined  Intimate Partner Violence: Not on file  Depression (PHQ2-9): Low Risk (01/08/2022)   Depression (PHQ2-9)    PHQ-2 Score: 0  Alcohol Screen: Not on file  Housing: Patient Declined (05/20/2024)   Epic    Unable to Pay for Housing in the Last Year: Patient declined    Number of Times Moved in the Last Year: Not on file    Homeless in the Last Year: Patient declined  Utilities: Not on file  Health Literacy: Not on file      Review of Systems  All other systems reviewed and are negative.      Objective:   Physical Exam Vitals reviewed.  Constitutional:      Appearance: He is well-developed.  Cardiovascular:     Rate and Rhythm: Normal rate and regular rhythm.     Heart sounds: Normal heart sounds.  Pulmonary:     Effort: Pulmonary effort is normal.     Breath sounds: Normal breath sounds.           Assessment & Plan:  Benign essential HTN - Plan: Comprehensive metabolic panel with GFR  Anxiety Patient has been dealing with daily anxiety and panic attacks now for more than 3 months.  I believe he qualifies as generalized anxiety disorder.  We discussed his options and he would like to try Lexapro  10 mg a day and reassess in 6 weeks to see if his anxiety is better controlled.  I did give him Valium  5 mg every 8 hours as needed for panic attacks and encouraged him to use this sparingly.  I also want to treat his blood pressure by starting losartan  100 mg daily.  Check baseline CMP today to  monitor renal function.  I believe some of his blood pressure certainly could be due to severe anxiety however I believe some of his blood pressure could be essential hypertension.  I plan to recheck his blood pressure in 4 to 6 weeks and add hydrochlorothiazide if necessary.  Monitor for any dizziness or lightheadedness due to hypotension "

## 2024-05-24 ENCOUNTER — Ambulatory Visit: Payer: Self-pay | Admitting: Family Medicine

## 2024-05-31 ENCOUNTER — Other Ambulatory Visit: Payer: Self-pay | Admitting: Family Medicine

## 2024-07-02 ENCOUNTER — Ambulatory Visit: Admitting: Family Medicine
